# Patient Record
Sex: Female | Born: 1980 | Race: White | Hispanic: No | Smoking: Never smoker
Health system: Southern US, Community
[De-identification: ages and names within clinical notes are randomized; demographics above are authoritative.]

## PROBLEM LIST (undated history)

## (undated) DIAGNOSIS — N10 Acute tubulo-interstitial nephritis: Secondary | ICD-10-CM

## (undated) DIAGNOSIS — R7303 Prediabetes: Secondary | ICD-10-CM

## (undated) DIAGNOSIS — K219 Gastro-esophageal reflux disease without esophagitis: Secondary | ICD-10-CM

## (undated) DIAGNOSIS — G43909 Migraine, unspecified, not intractable, without status migrainosus: Secondary | ICD-10-CM

## (undated) DIAGNOSIS — K5900 Constipation, unspecified: Secondary | ICD-10-CM

## (undated) DIAGNOSIS — T8859XA Other complications of anesthesia, initial encounter: Secondary | ICD-10-CM

## (undated) DIAGNOSIS — T4145XA Adverse effect of unspecified anesthetic, initial encounter: Secondary | ICD-10-CM

## (undated) DIAGNOSIS — G8929 Other chronic pain: Secondary | ICD-10-CM

## (undated) DIAGNOSIS — M549 Dorsalgia, unspecified: Secondary | ICD-10-CM

## (undated) DIAGNOSIS — Z87442 Personal history of urinary calculi: Secondary | ICD-10-CM

## (undated) DIAGNOSIS — F419 Anxiety disorder, unspecified: Secondary | ICD-10-CM

## (undated) HISTORY — PX: LITHOTRIPSY: SUR834

---

## 2005-01-24 HISTORY — PX: TUBAL LIGATION: SHX77

## 2011-01-25 HISTORY — PX: REDUCTION MAMMAPLASTY: SUR839

## 2011-05-25 HISTORY — PX: ABDOMINAL HYSTERECTOMY: SHX81

## 2012-07-07 ENCOUNTER — Encounter (HOSPITAL_COMMUNITY): Payer: Self-pay | Admitting: *Deleted

## 2012-07-07 DIAGNOSIS — R51 Headache: Secondary | ICD-10-CM | POA: Insufficient documentation

## 2012-07-07 DIAGNOSIS — R110 Nausea: Secondary | ICD-10-CM | POA: Insufficient documentation

## 2012-07-07 DIAGNOSIS — M542 Cervicalgia: Secondary | ICD-10-CM | POA: Insufficient documentation

## 2012-07-07 MED ORDER — ONDANSETRON HCL 4 MG PO TABS: 8.0000 mg | ORAL_TABLET | Freq: Once | ORAL | Status: DC

## 2012-07-07 MED ORDER — ONDANSETRON 4 MG PO TBDP
ORAL_TABLET | ORAL | Status: AC
  Administered 2012-07-07: 8 mg
  Filled 2012-07-07: qty 2

## 2012-07-07 NOTE — ED Notes (Signed)
Real bad migraine.[place] Pages oxycodone 10 mg ~ 1700 and x 20 mg prior to coming to ED.   No relief.  Frontal h/a and neck pain. Nauseated.

## 2012-07-08 ENCOUNTER — Emergency Department (HOSPITAL_COMMUNITY)
Admission: EM | Admit: 2012-07-08 | Discharge: 2012-07-08 | Discharge status: Against Medical Advice | Payer: BC Managed Care – PPO | Attending: Emergency Medicine | Admitting: Emergency Medicine

## 2012-07-08 HISTORY — DX: Migraine, unspecified, not intractable, without status migrainosus: G43.909

## 2012-07-08 NOTE — ED Notes (Signed)
Pt states that she has been having headaches today.  She did have some nausea, but took some phenergan she was prescribe to relieve.  Pt states that she only needs a shot to help with the pain.

## 2012-11-09 ENCOUNTER — Emergency Department (HOSPITAL_COMMUNITY)
Admission: EM | Admit: 2012-11-09 | Discharge: 2012-11-09 | Disposition: A | Discharge status: Home / Self Care | Payer: BC Managed Care – PPO | Attending: Emergency Medicine | Admitting: Emergency Medicine

## 2012-11-09 ENCOUNTER — Encounter (HOSPITAL_COMMUNITY): Payer: Self-pay | Admitting: Emergency Medicine

## 2012-11-09 ENCOUNTER — Other Ambulatory Visit: Payer: Self-pay

## 2012-11-09 ENCOUNTER — Other Ambulatory Visit: Payer: Self-pay | Admitting: Urology

## 2012-11-09 DIAGNOSIS — R0789 Other chest pain: Secondary | ICD-10-CM | POA: Insufficient documentation

## 2012-11-09 DIAGNOSIS — R1032 Left lower quadrant pain: Secondary | ICD-10-CM | POA: Insufficient documentation

## 2012-11-09 DIAGNOSIS — Z90710 Acquired absence of both cervix and uterus: Secondary | ICD-10-CM | POA: Insufficient documentation

## 2012-11-09 DIAGNOSIS — G43909 Migraine, unspecified, not intractable, without status migrainosus: Secondary | ICD-10-CM | POA: Insufficient documentation

## 2012-11-09 LAB — CBC WITH DIFFERENTIAL/PLATELET
Eosinophils Absolute: 0.1 10*3/uL (ref 0.0–0.7)
Hemoglobin: 11.5 g/dL — ABNORMAL LOW (ref 12.0–15.0)
Lymphocytes Relative: 24 % (ref 12–46)
Lymphs Abs: 2.3 10*3/uL (ref 0.7–4.0)
Monocytes Relative: 6 % (ref 3–12)
Neutro Abs: 6.8 10*3/uL (ref 1.7–7.7)
Neutrophils Relative %: 70 % (ref 43–77)
Platelets: 182 10*3/uL (ref 150–400)
RBC: 4.67 MIL/uL (ref 3.87–5.11)
WBC: 9.8 10*3/uL (ref 4.0–10.5)

## 2012-11-09 LAB — COMPREHENSIVE METABOLIC PANEL
AST: 17 U/L (ref 0–37)
Albumin: 3.8 g/dL (ref 3.5–5.2)
Chloride: 102 mEq/L (ref 96–112)
Creatinine, Ser: 0.75 mg/dL (ref 0.50–1.10)
Total Bilirubin: 0.5 mg/dL (ref 0.3–1.2)
Total Protein: 7.6 g/dL (ref 6.0–8.3)

## 2012-11-09 LAB — LIPASE, BLOOD: Lipase: 48 U/L (ref 11–59)

## 2012-11-09 LAB — URINALYSIS, ROUTINE W REFLEX MICROSCOPIC
Glucose, UA: NEGATIVE mg/dL
Ketones, ur: NEGATIVE mg/dL
Leukocytes, UA: NEGATIVE
Nitrite: NEGATIVE
Specific Gravity, Urine: 1.032 — ABNORMAL HIGH (ref 1.005–1.030)
pH: 5.5 (ref 5.0–8.0)

## 2012-11-09 LAB — CG4 I-STAT (LACTIC ACID): Lactic Acid, Venous: 0.45 mmol/L — ABNORMAL LOW (ref 0.5–2.2)

## 2012-11-09 MED ORDER — ONDANSETRON HCL 4 MG/2ML IJ SOLN: 4.0000 mg | Freq: Once | INTRAMUSCULAR | Status: DC

## 2012-11-09 MED ORDER — METOCLOPRAMIDE HCL 5 MG/ML IJ SOLN
10.0000 mg | Freq: Once | INTRAMUSCULAR | Status: AC
  Administered 2012-11-09: 10 mg via INTRAVENOUS
  Filled 2012-11-09: qty 2

## 2012-11-09 MED ORDER — POTASSIUM CHLORIDE CRYS ER 20 MEQ PO TBCR
20.0000 meq | EXTENDED_RELEASE_TABLET | Freq: Once | ORAL | Status: AC
  Administered 2012-11-09: 20 meq via ORAL
  Filled 2012-11-09: qty 1

## 2012-11-09 MED ORDER — HYDROCODONE-ACETAMINOPHEN 5-325 MG PO TABS: 2.0000 | ORAL_TABLET | ORAL | Status: DC | PRN

## 2012-11-09 MED ORDER — SODIUM CHLORIDE 0.9 % IV BOLUS (SEPSIS)
1000.0000 mL | Freq: Once | INTRAVENOUS | Status: AC
  Administered 2012-11-09: 1000 mL via INTRAVENOUS

## 2012-11-09 MED ORDER — HYDROMORPHONE HCL PF 1 MG/ML IJ SOLN
1.0000 mg | Freq: Once | INTRAMUSCULAR | Status: AC
  Administered 2012-11-09: 1 mg via INTRAVENOUS
  Filled 2012-11-09: qty 1

## 2012-11-09 MED ORDER — HYDROMORPHONE HCL PF 1 MG/ML IJ SOLN
0.5000 mg | Freq: Once | INTRAMUSCULAR | Status: AC
  Administered 2012-11-09: 0.5 mg via INTRAVENOUS
  Filled 2012-11-09: qty 1

## 2012-11-09 NOTE — ED Notes (Signed)
Pt tolerating water.  

## 2012-11-09 NOTE — ED Notes (Signed)
Patient unable to urinate at this time. Patient asked about blood work and pain medicine. Explained to patient that the nurse is going to start an IV. I told patient that I would let the nurse know about the pain meds.

## 2012-11-09 NOTE — ED Notes (Addendum)
Pt complains of abd pain, that has been going on for several months, worse since 0300 today. Pt has been to PCP [facility] a week ago and has been diagnosed with "gallbladder and pancreas" problem. Pt was to follow up with specialist, but pain is too bad. Pt also having surgery on Monday for kidney stone removal. Pt states she has also been having chest tightness since this am. Denies sob or dizziness.

## 2012-11-09 NOTE — ED Provider Notes (Signed)
CSN: [id number]     Arrival date & time 11/09/12  1638 History   First MD Initiated Contact with Patient 11/09/12 1651     Chief Complaint  Patient presents with  . Abdominal Pain   (Consider location/radiation/quality/duration/timing/severity/associated sxs/prior Treatment) HPI 32 year old female presents with multiple complaints. Patient states for the past month she has had recurrent abdominal pain. Pain is primarily to her low abdomen, described as a stabbing sensation, and for the past week it has been radiates to her upper abdomen into her back. Pain is waxing waning, nothing to make it better or worse. Today the pain is so bad that she experiencing chest tightness. Endorse nausea without vomiting or diarrhea. Last bowel movement was today. Does not think food affect the symptom. Patient states she was seen by her primary care Dr. for this complaint about a week ago. At that time she was diagnosed with having gallbladder and pancreas problem and was recommended to followup with a GI specialist. She was also found to have a large kidney stone that may require lithotripsy to remove. She was seen by urologist yesterday and is scheduled for procedure next week. Otherwise patient denies any significant fever, chills, headache, chest pain, shortness of breath, productive cough, hemoptysis, lightheadedness, dizziness, dysuria, vaginal bleeding, vaginal discharge, or rash. She reports that she has a total hysterectomy. She also mentioned that she has a urine checked yesterday with no signs of infection. She also report having a pelvic exam done last week had a PCP with no acute finding. She is sexually active with one partner only. She denies history of diabetes or history of alcohol abuse.   Past Medical History  Diagnosis Date  . Migraine    Past Surgical History  Procedure Laterality Date  . Abdominal hysterectomy    . Breast reduction surgery     No family history on file. History   Substance Use Topics  . Smoking status: Never Smoker   . Smokeless tobacco: Not on file  . Alcohol Use: No   OB History   Grav Para Term Preterm Abortions TAB SAB Ect Mult Living                 Review of Systems  All other systems reviewed and are negative.    Allergies  Review of patient's allergies indicates no known allergies.  Home Medications   Current Outpatient Rx  Name  Route  Sig  Dispense  Refill  . Oxycodone HCl 10 MG TABS   Oral   Take 10-20 mg by mouth as needed (for migraine headache).          . promethazine (PHENERGAN) 25 MG tablet   Oral   Take 25 mg by mouth as needed for nausea (associated with migraines).           BP 110/77  Pulse 89  Temp(Src) 98.5 F (36.9 C) (Oral)  Resp 20  Wt 179 lb (81.194 kg)  SpO2 99% Physical Exam  Nursing note and vitals reviewed. Constitutional: She is oriented to person, place, and time. She appears well-developed and well-nourished. No distress.  Awake, alert, nontoxic appearance  HENT:  Head: Atraumatic.  Eyes: Conjunctivae are normal. Right eye exhibits no discharge. Left eye exhibits no discharge.  Neck: Neck supple.  Cardiovascular: Normal rate and regular rhythm.   Pulmonary/Chest: Effort normal. No respiratory distress. She exhibits no tenderness.  Abdominal: Soft. There is tenderness (Mild suprapubic and left lower quadrant tenderness on palpation without guarding or rebound  tenderness. No overlying skin changes. No rash or hernia noted.). There is no rebound.  No Murphy sign, no McBurney's point.  Genitourinary:  Deferred, as patient report having pelvic exam last week.  No CVA tenderness   Musculoskeletal: She exhibits no tenderness.  ROM appears intact, no obvious focal weakness  Neurological: She is alert and oriented to person, place, and time.  Mental status and motor strength appears intact  Skin: No rash noted.  Psychiatric: She has a normal mood and affect.    ED Course  Procedures  (including critical care time)  5:28 PM Patient here with multiple complaints including chest tightness, abdominal pain, and reports of having gallbladder and pancreas problem. States she has an abdominal ultrasound recently and is currently managed by a GI specialist.  Pt also report she has pelvic exam done last week and does not want to have it repeat today.  Sts everything was normal.  Will defer testing per pt's request. She is well appearing, afebrile with stable normal vital signs. She has a nonsurgical abdomen. Workup initiated, pain medication and antinausea medication given.  Care discussed with attending.  7:54 PM Pt sts she feels much better and request to be discharge.  She able to tolerates PO.  Her labs are reassuring.  She has close follow up both with GI specialist and with Urologist. Her urine and CBC has not resulted yet.  Pt aware but sts she has it checked recently and it was normal and she feels safe to go home.  I recommend strict return precaution if sxs worsen.  Pt voice understanding and agrees with plan.     Date: 11/09/2012  Rate: 84  Rhythm: normal sinus rhythm  QRS Axis: normal  Intervals: normal  ST/T Wave abnormalities: normal  Conduction Disutrbances: none  Narrative Interpretation:   Old EKG Reviewed: no prior for comparison    Labs Review Labs Reviewed  COMPREHENSIVE METABOLIC PANEL - Abnormal; Notable for the following:    Potassium 3.3 (*)    All other components within normal limits  CG4 I-STAT (LACTIC ACID) - Abnormal; Notable for the following:    Lactic Acid, Venous 0.45 (*)    All other components within normal limits  LIPASE, BLOOD  URINALYSIS, ROUTINE W REFLEX MICROSCOPIC  CBC WITH DIFFERENTIAL   Imaging Review No results found.  EKG Interpretation   None       MDM   1. Abdominal pain, lower, unspecified laterality    BP 110/77  Pulse 89  Temp(Src) 98.5 F (36.9 C) (Oral)  Resp 20  Wt 179 lb (81.194 kg)  SpO2  99%      Fayrene Helper, PA-C 11/09/12 1956

## 2012-11-10 NOTE — ED Provider Notes (Signed)
Medical screening examination/treatment/procedure(s) were performed by non-physician practitioner and as supervising physician I was immediately available for consultation/collaboration.   Candyce Churn, MD 11/10/12 0000

## 2012-11-11 DIAGNOSIS — N200 Calculus of kidney: Secondary | ICD-10-CM | POA: Diagnosis present

## 2012-11-11 NOTE — H&P (Signed)
istory of Present Illness     This is a 32 year old female with a nonobstructing 8 mm right renal calculus noted on ultrasound as part of a workup for abdominal pain and elevated pancreatic enzymes.  Patient's symptoms include pain radiating down her legs bilaterally worse when she walks.  She also is complaining of pain radiating around from her back bilaterally.  She denies any flank pain specifically and is not complaining of any dysuria, hematuria, or suprapubic pain.  The pain seems to be constant in her legs and intermittent in her abdomen associated with eating.  Also noted on the abdominal ultrasound was a thickened gallbladder wall.  The patient has had lithotripsy on her right side once before - she relates that she had 2 kidney stones on the right side at that time.  She had no issues with this.  He is interested in taking care of her stone before it causes her any further problems.   Past Medical History Problems  1. History of  Esophageal Reflux 530.81  Surgical History Problems  1. History of  Breast Surgery Reduction Procedure 2. History of  Hysterectomy V45.77 3. History of  Tubal Ligation V25.2  Current Meds 1. Percocet 10-325 MG Oral Tablet; Therapy: (Recorded:16Oct2014) to 2. Phenergan 25 MG TABS; Therapy: (Recorded:16Oct2014) to 3. Phentermine HCl 37.5 MG Oral Capsule; Therapy: (Recorded:16Oct2014) to  Allergies Medication  1. No Known Drug Allergies  Family History Problems  1. Paternal history of  Family Health Status Number Of Children 1 son 2 daughters 2. Paternal history of  Hematuria 3. Paternal history of  Prostate Cancer V16.42  Social History Problems    Caffeine Use   Marital History - Single   Never A Smoker Denied    History of  Alcohol Use  Review of Systems  Genitourinary: nocturia, hematuria and cloudy urine.  Gastrointestinal: nausea, vomiting, heartburn, diarrhea and constipation.  Constitutional: feeling tired (fatigue).   Integumentary: pruritus.  Eyes: blurred vision.  ENT: sore throat and sinus problems.  Hematologic/Lymphatic: a tendency to easily bruise.  Cardiovascular: chest pain.  Neurological: dizziness and headache.  Psychiatric: depression and anxiety.    Vitals Vital Signs  Blood Pressure: 99 / 62 Temperature: 97.6 F Heart Rate: 76  BMI Calculated: 33.79 BSA Calculated: 1.8 Height: 5 ft 1 in Weight: 179 lb   Physical Exam Constitutional: Well nourished and well developed . No acute distress. Obese.  Pulmonary: No respiratory distress and normal respiratory rhythm and effort.  Skin: Normal skin turgor, no visible rash and no visible skin lesions.  Neuro/Psych:. Mood and affect are appropriate.    Results/Data Urine COLOR YELLOW   APPEARANCE CLEAR   SPECIFIC GRAVITY 1.025   pH 6.5   GLUCOSE NEG mg/dL  BILIRUBIN NEG   KETONE NEG mg/dL  BLOOD NEG   PROTEIN NEG mg/dL  UROBILINOGEN 0.2 mg/dL  NITRITE NEG   LEUKOCYTE ESTERASE NEG     Abdominal ultrasound report was reviewed, there is note of an 8 mm hypoechoic area in the right kidney with posterior shadowing.  There is also mention of a right-sided extrarenal pelvis.    KUB: Image was obtained in our office today to help identify her stone.  Both kidney shadows are visible and  within the right kidney shadow there is a 6 x 6 cm stone located in the lower pole.  There are no additional stones within either kidney shadow or the trajectory of the ureter.  There are scattered calculi within the pelvis.  Assessment  Nonobstructing right renal calculi   Plan   I discussed the options for managing  nonobstructing kidney stones.  He understands that his stone is likely to increase in size and that over time this may cause obstructing to part of his kidney resulting in decreased renal function.I went over conservative management consisting of surviellence with serial imaging with KUB and renal ultrasound to assess for interval growth.   We also discussed more aggressive interventions including Shockwave lithotripsy and ureteroscopy.  I briefly explained to him what the different treatment modalities consisted of, and what he might expect with each treatment.    I have recommended shockwave lithotripsy.

## 2012-11-12 ENCOUNTER — Encounter (HOSPITAL_COMMUNITY): Payer: Self-pay | Admitting: *Deleted

## 2012-11-12 ENCOUNTER — Encounter (HOSPITAL_COMMUNITY)
Admission: RE | Disposition: A | Discharge status: Home / Self Care | Payer: Self-pay | Source: Ambulatory Visit | Attending: Urology

## 2012-11-12 ENCOUNTER — Ambulatory Visit (HOSPITAL_COMMUNITY): Payer: BC Managed Care – PPO

## 2012-11-12 ENCOUNTER — Ambulatory Visit (HOSPITAL_COMMUNITY)
Admission: RE | Admit: 2012-11-12 | Discharge: 2012-11-12 | Disposition: A | Discharge status: Home / Self Care | Payer: BC Managed Care – PPO | Source: Ambulatory Visit | Attending: Urology | Admitting: Urology

## 2012-11-12 DIAGNOSIS — N200 Calculus of kidney: Secondary | ICD-10-CM | POA: Diagnosis present

## 2012-11-12 DIAGNOSIS — K219 Gastro-esophageal reflux disease without esophagitis: Secondary | ICD-10-CM | POA: Insufficient documentation

## 2012-11-12 SURGERY — LITHOTRIPSY, ESWL
Anesthesia: LOCAL | Laterality: Right

## 2012-11-12 MED ORDER — DIAZEPAM 5 MG PO TABS
10.0000 mg | ORAL_TABLET | ORAL | Status: AC
  Administered 2012-11-12: 10 mg via ORAL
  Filled 2012-11-12: qty 2

## 2012-11-12 MED ORDER — TAMSULOSIN HCL 0.4 MG PO CAPS: 0.4000 mg | ORAL_CAPSULE | ORAL | Status: DC

## 2012-11-12 MED ORDER — SODIUM CHLORIDE 0.9 % IV SOLN
INTRAVENOUS | Status: DC
  Administered 2012-11-12: 08:00:00 via INTRAVENOUS

## 2012-11-12 MED ORDER — CIPROFLOXACIN HCL 500 MG PO TABS
500.0000 mg | ORAL_TABLET | ORAL | Status: DC
  Filled 2012-11-12: qty 1

## 2012-11-12 MED ORDER — DIPHENHYDRAMINE HCL 25 MG PO CAPS
25.0000 mg | ORAL_CAPSULE | ORAL | Status: AC
  Administered 2012-11-12: 25 mg via ORAL
  Filled 2012-11-12: qty 1

## 2012-11-12 MED ORDER — CIPROFLOXACIN IN D5W 400 MG/200ML IV SOLN
400.0000 mg | Freq: Two times a day (BID) | INTRAVENOUS | Status: DC
  Administered 2012-11-12: 400 mg via INTRAVENOUS
  Filled 2012-11-12: qty 200

## 2012-11-12 MED ORDER — OXYCODONE-ACETAMINOPHEN 10-325 MG PO TABS: 1.0000 | ORAL_TABLET | ORAL | Status: DC | PRN

## 2012-11-12 MED ORDER — OXYCODONE-ACETAMINOPHEN 5-325 MG PO TABS
1.0000 | ORAL_TABLET | Freq: Once | ORAL | Status: AC
  Administered 2012-11-12: 1 via ORAL
  Filled 2012-11-12: qty 1

## 2012-11-12 NOTE — Op Note (Signed)
See [facility] OP note scanned into chart. 

## 2012-11-12 NOTE — Interval H&P Note (Signed)
History and Physical Interval Note:  11/12/2012 9:43 AM  Mary Morse  has presented today for surgery, with the diagnosis of Right Renal Calculus  The various methods of treatment have been discussed with the patient and family. After consideration of risks, benefits and other options for treatment, the patient has consented to  Procedure(s): RIGHT EXTRACORPOREAL SHOCK WAVE LITHOTRIPSY (ESWL) (Right) as a surgical intervention .  The patient's history has been reviewed, patient examined, no change in status, stable for surgery.  I have reviewed the patient's chart and labs.  Questions were answered to the patient's satisfaction.     G[place]ett Farm

## 2012-11-13 ENCOUNTER — Ambulatory Visit (INDEPENDENT_AMBULATORY_CARE_PROVIDER_SITE_OTHER): Payer: BC Managed Care – PPO | Admitting: General Surgery

## 2012-11-13 ENCOUNTER — Encounter (INDEPENDENT_AMBULATORY_CARE_PROVIDER_SITE_OTHER): Payer: Self-pay | Admitting: General Surgery

## 2012-11-13 ENCOUNTER — Other Ambulatory Visit (INDEPENDENT_AMBULATORY_CARE_PROVIDER_SITE_OTHER): Payer: Self-pay | Admitting: General Surgery

## 2012-11-13 ENCOUNTER — Telehealth (INDEPENDENT_AMBULATORY_CARE_PROVIDER_SITE_OTHER): Payer: Self-pay | Admitting: *Deleted

## 2012-11-13 VITALS — BP 118/70 | HR 64 | Temp 98.4°F | Resp 14 | Ht 61.0 in | Wt 182.8 lb

## 2012-11-13 DIAGNOSIS — R1031 Right lower quadrant pain: Secondary | ICD-10-CM

## 2012-11-13 DIAGNOSIS — K8020 Calculus of gallbladder without cholecystitis without obstruction: Secondary | ICD-10-CM | POA: Insufficient documentation

## 2012-11-13 NOTE — Telephone Encounter (Signed)
I spoke with pt to inform her of appt for [place] at [facility] on 11/14/12 with an arrival time of 10:30am.  Instructed pt to be NPO after midnight tonight.  Pt requested phone number so I provided this to pt.

## 2012-11-13 NOTE — Patient Instructions (Signed)
Will obtain records from medical doctor If there are gallstones then plan for lap chole with ioc

## 2012-11-14 ENCOUNTER — Ambulatory Visit
Admission: RE | Admit: 2012-11-14 | Discharge: 2012-11-14 | Disposition: A | Discharge status: Home / Self Care | Payer: BC Managed Care – PPO | Source: Ambulatory Visit | Attending: General Surgery | Admitting: General Surgery

## 2012-11-14 ENCOUNTER — Other Ambulatory Visit (INDEPENDENT_AMBULATORY_CARE_PROVIDER_SITE_OTHER): Payer: Self-pay | Admitting: General Surgery

## 2012-11-14 DIAGNOSIS — K8020 Calculus of gallbladder without cholecystitis without obstruction: Secondary | ICD-10-CM

## 2012-11-14 NOTE — Progress Notes (Signed)
Patient ID: Mary Morse, female   DOB: Feb 05, 1980, 32 y.o.   MRN: [id number]  Chief Complaint  Patient presents with  . New Evaluation    eval GB/ pancreas    HPI Mary Morse is a 32 y.o. female.  We're asked to see the patient in consultation by Dr. Laural Benes at [facility] in [place] to evaluate her for gallstones. The patient is a 32 year old white female who has been complaining of lower abnormal pain on both sides for the last month. She has had nausea and several episodes of vomiting. As part of her workup she underwent an ultrasound in the office at her medical doctors practice that apparently showed some inflammatory change of her gallbladder wall. It did not identify any gallstones. At that time she also had some slight elevation of her lipase. Her most recent lab work several days ago showed normal liver functions and a normal lipase. She continues to have constant lower abdominal pain. HPI  Past Medical History  Diagnosis Date  . Migraine   . Kidney stone     Past Surgical History  Procedure Laterality Date  . Abdominal hysterectomy    . Breast reduction surgery    . Tubal ligation      Family History  Problem Relation Age of Onset  . Cancer Father     prostate  . Cancer Paternal Grandmother     breast    Social History History  Substance Use Topics  . Smoking status: Never Smoker   . Smokeless tobacco: Never Used  . Alcohol Use: No    No Known Allergies  Current Outpatient Prescriptions  Medication Sig Dispense Refill  . ALPRAZolam (XANAX) 1 MG tablet Take 1 mg by mouth at bedtime as needed for anxiety.      . Linaclotide (LINZESS) 290 MCG CAPS capsule Take 290 mcg by mouth daily.      Marland Kitchen oxyCODONE-acetaminophen (PERCOCET) 10-325 MG per tablet Take 1-2 tablets by mouth every 4 (four) hours as needed for pain.  30 tablet  0  . promethazine (PHENERGAN) 25 MG tablet Take 25 mg by mouth as needed for nausea.       . tamsulosin (FLOMAX) 0.4 MG  CAPS capsule Take 1 capsule (0.4 mg total) by mouth daily after supper.  30 capsule  11  . phentermine 37.5 MG capsule Take 37.5 mg by mouth every morning.       No current facility-administered medications for this visit.    Review of Systems Review of Systems  Constitutional: Negative.   HENT: Negative.   Eyes: Negative.   Respiratory: Negative.   Cardiovascular: Negative.   Gastrointestinal: Positive for nausea and abdominal pain.  Endocrine: Negative.   Genitourinary: Negative.   Musculoskeletal: Negative.   Skin: Negative.   Allergic/Immunologic: Negative.   Neurological: Negative.   Hematological: Negative.   Psychiatric/Behavioral: Negative.     Blood pressure 118/70, pulse 64, temperature 98.4 F (36.9 C), temperature source Temporal, resp. rate 14, height 5\' 1"  (1.549 m), weight 182 lb 12.8 oz (82.918 kg).  Physical Exam Physical Exam  Constitutional: She is oriented to person, place, and time. She appears well-developed and well-nourished.  HENT:  Head: Normocephalic and atraumatic.  Eyes: Conjunctivae and EOM are normal. Pupils are equal, round, and reactive to light.  Neck: Normal range of motion. Neck supple.  Cardiovascular: Normal rate, regular rhythm and normal heart sounds.   Pulmonary/Chest: Effort normal and breath sounds normal.  Abdominal: Soft. Bowel sounds are normal.  There is mild to moderate lower abdominal pain with no guarding or peritonitis. There is no palpable mass. There is also some mild right upper quadrant tenderness.  Musculoskeletal: Normal range of motion.  Neurological: She is alert and oriented to person, place, and time.  Skin: Skin is warm and dry.  Psychiatric: She has a normal mood and affect. Her behavior is normal.    Data Reviewed As above  Assessment    It sounds as though the patient may have had an episode of gallstone pancreatitis although her previous ultrasound did not show any gallstones. Although her lab work is  all normal if she continues to have lower abdominal pain and some mild right upper quadrant pain. Since her finding seemed to be discordant with her exam I think it would be reasonable to repeat her abdominal ultrasound locally with our radiologist.     Plan    If she does have evidence of biliary disease and I think she would probably benefit from having her gallbladder removed. I think she would be a good candidate for laparoscopic cholecystectomy. I have discussed with her in detail the risks and benefits of the operation to remove the gallbladder as well as some of the technical aspects and she understands and wishes to proceed. She also understands that I cannot guarantee that removing her gallbladder will take all of her pains away since her pains are atypical.        TOTH III,Nolene Rocks S 11/14/2012, 1:20 PM

## 2012-11-15 ENCOUNTER — Other Ambulatory Visit (INDEPENDENT_AMBULATORY_CARE_PROVIDER_SITE_OTHER): Payer: Self-pay | Admitting: General Surgery

## 2012-11-15 ENCOUNTER — Telehealth (INDEPENDENT_AMBULATORY_CARE_PROVIDER_SITE_OTHER): Payer: Self-pay

## 2012-11-15 NOTE — Telephone Encounter (Signed)
The pt called back.  She wants to know what Dr Carolynne Edouard recommends she do.  She asked if the polyps can just be removed or can you only remove the whole gallbladder?  If not, she would just like surgery.  She is worried of the chance of cancer.  Please call her back

## 2012-11-15 NOTE — Telephone Encounter (Signed)
orers given to stephanie in surgery scheduling.

## 2012-11-15 NOTE — Telephone Encounter (Signed)
Tried calling pt back. No answer. Will have to take entire GB out. Cant just take out polyps. As for her being scared of cancer, looks benign but can never say 100% without getting it out and sending it to pathology. Orders are ready if she decides to schedule. Just need to know. If she has any more questions when she calls back I will be glad to talk with her.

## 2012-11-15 NOTE — Telephone Encounter (Signed)
LMOM> Please give [place] results below. If she would like GB out please let us know and he can do orders. Advise this may not fix all her pain though.

## 2012-11-15 NOTE — Telephone Encounter (Signed)
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 This can sometimes cause pain. i am willing to take it out but i don't think this is going to fix all her pains ------

## 2012-11-15 NOTE — Telephone Encounter (Signed)
I informed the pt of the results and Dr Billey Chang message.  She wants to talk to her Fiance and will let us know if she wants to schedule.

## 2012-11-15 NOTE — Telephone Encounter (Signed)
Pt returned call. I gave her the message from [place] about needing the whole gallbladder removed to get the polyps out in order to send the specimen to pathology to know 100% what was going on with the polyps along with the gallbladder. The pt wants to proceed with scheduling surgery. I notified Marcelino Duster who will turn the pt's surgical orders into scheduling today.

## 2012-11-16 ENCOUNTER — Ambulatory Visit (INDEPENDENT_AMBULATORY_CARE_PROVIDER_SITE_OTHER): Payer: BC Managed Care – PPO | Admitting: General Surgery

## 2012-11-19 ENCOUNTER — Telehealth (INDEPENDENT_AMBULATORY_CARE_PROVIDER_SITE_OTHER): Payer: Self-pay | Admitting: General Surgery

## 2012-11-19 NOTE — Telephone Encounter (Signed)
I spoke with pt on 10/23 and went over benefits and financial responsibility, she will call me back when she is ready to schedule. skm

## 2012-11-21 ENCOUNTER — Encounter (INDEPENDENT_AMBULATORY_CARE_PROVIDER_SITE_OTHER): Payer: Self-pay

## 2012-11-26 ENCOUNTER — Encounter (INDEPENDENT_AMBULATORY_CARE_PROVIDER_SITE_OTHER): Payer: Self-pay

## 2012-11-28 ENCOUNTER — Encounter (HOSPITAL_COMMUNITY): Payer: Self-pay | Admitting: Pharmacy Technician

## 2012-11-29 ENCOUNTER — Encounter (INDEPENDENT_AMBULATORY_CARE_PROVIDER_SITE_OTHER): Payer: Self-pay

## 2012-11-30 NOTE — Progress Notes (Signed)
Surgery day has been moved from Monday to Thursday, November 13th.  I was unable to reach patient by phone,  I left a voice massage that we will call her next week and that she needs to  Have been off Phentermine 2 weeks.

## 2012-12-04 ENCOUNTER — Encounter (HOSPITAL_COMMUNITY): Payer: Self-pay | Admitting: *Deleted

## 2012-12-04 NOTE — Progress Notes (Signed)
Patient reported that she had an ECHO prior to starting Phentermine at [facility] in [place], [place].  I faxed a request for this report and last office note.Patient stated that she stopped taking Phentermine around the first of October, 2014.

## 2012-12-05 MED ORDER — CEFAZOLIN SODIUM-DEXTROSE 2-3 GM-% IV SOLR
2.0000 g | INTRAVENOUS | Status: AC
  Administered 2012-12-06: 2 g via INTRAVENOUS

## 2012-12-06 ENCOUNTER — Encounter (HOSPITAL_COMMUNITY)
Admission: RE | Disposition: A | Discharge status: Home / Self Care | Payer: Self-pay | Source: Ambulatory Visit | Attending: General Surgery

## 2012-12-06 ENCOUNTER — Observation Stay (HOSPITAL_COMMUNITY)
Admission: RE | Admit: 2012-12-06 | Discharge: 2012-12-07 | Disposition: A | Discharge status: Home / Self Care | Payer: BC Managed Care – PPO | Source: Ambulatory Visit | Attending: General Surgery | Admitting: General Surgery

## 2012-12-06 ENCOUNTER — Encounter (HOSPITAL_COMMUNITY): Payer: BC Managed Care – PPO | Admitting: Anesthesiology

## 2012-12-06 ENCOUNTER — Encounter (HOSPITAL_COMMUNITY): Payer: Self-pay | Admitting: General Practice

## 2012-12-06 ENCOUNTER — Ambulatory Visit (HOSPITAL_COMMUNITY): Payer: BC Managed Care – PPO

## 2012-12-06 ENCOUNTER — Ambulatory Visit (HOSPITAL_COMMUNITY): Payer: BC Managed Care – PPO | Admitting: Anesthesiology

## 2012-12-06 DIAGNOSIS — E119 Type 2 diabetes mellitus without complications: Secondary | ICD-10-CM | POA: Insufficient documentation

## 2012-12-06 DIAGNOSIS — K811 Chronic cholecystitis: Principal | ICD-10-CM | POA: Insufficient documentation

## 2012-12-06 DIAGNOSIS — R1011 Right upper quadrant pain: Secondary | ICD-10-CM | POA: Insufficient documentation

## 2012-12-06 DIAGNOSIS — K8020 Calculus of gallbladder without cholecystitis without obstruction: Secondary | ICD-10-CM

## 2012-12-06 HISTORY — DX: Constipation, unspecified: K59.00

## 2012-12-06 HISTORY — DX: Other complications of anesthesia, initial encounter: T88.59XA

## 2012-12-06 HISTORY — DX: Adverse effect of unspecified anesthetic, initial encounter: T41.45XA

## 2012-12-06 HISTORY — DX: Prediabetes: R73.03

## 2012-12-06 HISTORY — DX: Anxiety disorder, unspecified: F41.9

## 2012-12-06 HISTORY — PX: CHOLECYSTECTOMY: SHX55

## 2012-12-06 HISTORY — DX: Other chronic pain: G89.29

## 2012-12-06 HISTORY — DX: Dorsalgia, unspecified: M54.9

## 2012-12-06 LAB — CREATININE, SERUM
GFR calc Af Amer: >90 mL/min (ref >90)
GFR calc non Af Amer: >90 mL/min (ref >90)

## 2012-12-06 LAB — CBC
HCT: 36.6 % (ref 36.0–46.0)
Hemoglobin: 12.2 g/dL (ref 12.0–15.0)
MCH: 24.8 pg — ABNORMAL LOW (ref 26.0–34.0)
MCH: 25.4 pg — ABNORMAL LOW (ref 26.0–34.0)
MCV: 76.3 fL — ABNORMAL LOW (ref 78.0–100.0)
Platelets: 174 10*3/uL (ref 150–400)
Platelets: 154 10*3/uL (ref 150–400)
RBC: 4.8 MIL/uL (ref 3.87–5.11)
RDW: 15.7 % — ABNORMAL HIGH (ref 11.5–15.5)
RDW: 15.8 % — ABNORMAL HIGH (ref 11.5–15.5)
WBC: 9.7 10*3/uL (ref 4.0–10.5)
WBC: 11.2 10*3/uL — ABNORMAL HIGH (ref 4.0–10.5)

## 2012-12-06 LAB — GLUCOSE, CAPILLARY: Glucose-Capillary: 90 mg/dL (ref 70–99)

## 2012-12-06 SURGERY — LAPAROSCOPIC CHOLECYSTECTOMY WITH INTRAOPERATIVE CHOLANGIOGRAM
Anesthesia: General | Site: Abdomen | Wound class: Contaminated

## 2012-12-06 MED ORDER — MIDAZOLAM HCL 2 MG/2ML IJ SOLN: 0.5000 mg | Freq: Once | INTRAMUSCULAR | Status: DC | PRN

## 2012-12-06 MED ORDER — OXYCODONE HCL 5 MG PO TABS
5.0000 mg | ORAL_TABLET | ORAL | Status: DC | PRN
  Administered 2012-12-07 (×2): 10 mg via ORAL
  Filled 2012-12-06 (×2): qty 2

## 2012-12-06 MED ORDER — OXYCODONE HCL 5 MG PO TABS: 5.0000 mg | ORAL_TABLET | Freq: Once | ORAL | Status: DC | PRN

## 2012-12-06 MED ORDER — GLYCOPYRROLATE 0.2 MG/ML IJ SOLN
INTRAMUSCULAR | Status: DC | PRN
  Administered 2012-12-06: 0.6 mg via INTRAVENOUS
  Administered 2012-12-06: 0.2 mg via INTRAVENOUS

## 2012-12-06 MED ORDER — OXYCODONE HCL 5 MG/5ML PO SOLN: 5.0000 mg | Freq: Once | ORAL | Status: DC | PRN

## 2012-12-06 MED ORDER — ALPRAZOLAM 0.5 MG PO TABS: 1.0000 mg | ORAL_TABLET | Freq: Every evening | ORAL | Status: DC | PRN

## 2012-12-06 MED ORDER — OXYCODONE-ACETAMINOPHEN 5-325 MG PO TABS
1.0000 | ORAL_TABLET | ORAL | Status: DC | PRN
  Administered 2012-12-06 – 2012-12-07 (×3): 2 via ORAL
  Filled 2012-12-06 (×3): qty 2

## 2012-12-06 MED ORDER — ONDANSETRON HCL 4 MG/2ML IJ SOLN
INTRAMUSCULAR | Status: DC | PRN
  Administered 2012-12-06: 4 mg via INTRAVENOUS

## 2012-12-06 MED ORDER — MEPERIDINE HCL 25 MG/ML IJ SOLN: 6.2500 mg | INTRAMUSCULAR | Status: DC | PRN

## 2012-12-06 MED ORDER — HYDROMORPHONE HCL PF 1 MG/ML IJ SOLN
INTRAMUSCULAR | Status: AC
  Filled 2012-12-06: qty 1

## 2012-12-06 MED ORDER — LIDOCAINE HCL (CARDIAC) 20 MG/ML IV SOLN
INTRAVENOUS | Status: DC | PRN
  Administered 2012-12-06: 80 mg via INTRAVENOUS

## 2012-12-06 MED ORDER — LACTATED RINGERS IV SOLN
INTRAVENOUS | Status: DC
  Administered 2012-12-06: 11:00:00 via INTRAVENOUS

## 2012-12-06 MED ORDER — BUPIVACAINE-EPINEPHRINE PF 0.25-1:200000 % IJ SOLN
INTRAMUSCULAR | Status: AC
  Filled 2012-12-06: qty 30

## 2012-12-06 MED ORDER — FENTANYL CITRATE 0.05 MG/ML IJ SOLN
INTRAMUSCULAR | Status: DC | PRN
  Administered 2012-12-06: 50 ug via INTRAVENOUS
  Administered 2012-12-06: 150 ug via INTRAVENOUS
  Administered 2012-12-06: 50 ug via INTRAVENOUS

## 2012-12-06 MED ORDER — SODIUM CHLORIDE 0.9 % IV SOLN
INTRAVENOUS | Status: DC | PRN
  Administered 2012-12-06: 14:00:00

## 2012-12-06 MED ORDER — CEFAZOLIN SODIUM-DEXTROSE 2-3 GM-% IV SOLR
INTRAVENOUS | Status: AC
  Filled 2012-12-06: qty 50

## 2012-12-06 MED ORDER — HYDROMORPHONE HCL PF 1 MG/ML IJ SOLN
INTRAMUSCULAR | Status: AC
Start: 2012-12-06 — End: 2012-12-07
  Filled 2012-12-06: qty 1

## 2012-12-06 MED ORDER — TAMSULOSIN HCL 0.4 MG PO CAPS
0.4000 mg | ORAL_CAPSULE | Freq: Every day | ORAL | Status: DC
  Administered 2012-12-06: 0.4 mg via ORAL
  Filled 2012-12-06 (×2): qty 1

## 2012-12-06 MED ORDER — PROPOFOL 10 MG/ML IV BOLUS
INTRAVENOUS | Status: DC | PRN
  Administered 2012-12-06: 200 mg via INTRAVENOUS

## 2012-12-06 MED ORDER — OXYCODONE-ACETAMINOPHEN 5-325 MG PO TABS: 1.0000 | ORAL_TABLET | ORAL | Status: DC | PRN

## 2012-12-06 MED ORDER — ROCURONIUM BROMIDE 100 MG/10ML IV SOLN
INTRAVENOUS | Status: DC | PRN
  Administered 2012-12-06: 20 mg via INTRAVENOUS
  Administered 2012-12-06: 30 mg via INTRAVENOUS

## 2012-12-06 MED ORDER — EPHEDRINE SULFATE 50 MG/ML IJ SOLN
INTRAMUSCULAR | Status: DC | PRN
  Administered 2012-12-06 (×2): 10 mg via INTRAVENOUS

## 2012-12-06 MED ORDER — PROMETHAZINE HCL 25 MG/ML IJ SOLN
INTRAMUSCULAR | Status: AC
  Filled 2012-12-06: qty 1

## 2012-12-06 MED ORDER — NEOSTIGMINE METHYLSULFATE 1 MG/ML IJ SOLN
INTRAMUSCULAR | Status: DC | PRN
  Administered 2012-12-06: 4 mg via INTRAVENOUS

## 2012-12-06 MED ORDER — PROMETHAZINE HCL 25 MG PO TABS: 25.0000 mg | ORAL_TABLET | ORAL | Status: DC | PRN

## 2012-12-06 MED ORDER — PROMETHAZINE HCL 25 MG/ML IJ SOLN
6.2500 mg | INTRAMUSCULAR | Status: DC | PRN
  Administered 2012-12-06: 6.25 mg via INTRAVENOUS

## 2012-12-06 MED ORDER — CHLORHEXIDINE GLUCONATE 4 % EX LIQD: 1.0000 "application " | Freq: Once | CUTANEOUS | Status: DC

## 2012-12-06 MED ORDER — SODIUM CHLORIDE 0.9 % IR SOLN
Status: DC | PRN
  Administered 2012-12-06: 1000 mL

## 2012-12-06 MED ORDER — HYDROMORPHONE HCL PF 1 MG/ML IJ SOLN
0.2500 mg | INTRAMUSCULAR | Status: DC | PRN
  Administered 2012-12-06 (×4): 0.5 mg via INTRAVENOUS

## 2012-12-06 MED ORDER — ONDANSETRON HCL 4 MG/2ML IJ SOLN: 4.0000 mg | Freq: Four times a day (QID) | INTRAMUSCULAR | Status: DC | PRN

## 2012-12-06 MED ORDER — LACTATED RINGERS IV SOLN
INTRAVENOUS | Status: DC | PRN
  Administered 2012-12-06 (×2): via INTRAVENOUS

## 2012-12-06 MED ORDER — KCL IN DEXTROSE-NACL 20-5-0.9 MEQ/L-%-% IV SOLN
INTRAVENOUS | Status: DC
  Administered 2012-12-06: 19:00:00 via INTRAVENOUS
  Filled 2012-12-06 (×3): qty 1000

## 2012-12-06 MED ORDER — HYDROMORPHONE HCL PF 1 MG/ML IJ SOLN
0.5000 mg | INTRAMUSCULAR | Status: AC | PRN
  Administered 2012-12-06 (×4): 0.5 mg via INTRAVENOUS

## 2012-12-06 MED ORDER — FENTANYL CITRATE 0.05 MG/ML IJ SOLN
50.0000 ug | INTRAMUSCULAR | Status: DC | PRN
  Administered 2012-12-07: 50 ug via INTRAVENOUS
  Filled 2012-12-06: qty 2

## 2012-12-06 MED ORDER — HEPARIN SODIUM (PORCINE) 5000 UNIT/ML IJ SOLN
5000.0000 [IU] | Freq: Three times a day (TID) | INTRAMUSCULAR | Status: DC
  Administered 2012-12-07: 5000 [IU] via SUBCUTANEOUS
  Filled 2012-12-06 (×4): qty 1

## 2012-12-06 MED ORDER — ONDANSETRON HCL 4 MG PO TABS: 4.0000 mg | ORAL_TABLET | Freq: Four times a day (QID) | ORAL | Status: DC | PRN

## 2012-12-06 MED ORDER — MIDAZOLAM HCL 5 MG/5ML IJ SOLN
INTRAMUSCULAR | Status: DC | PRN
  Administered 2012-12-06: 4 mg via INTRAVENOUS

## 2012-12-06 MED ORDER — HYDROMORPHONE HCL PF 1 MG/ML IJ SOLN
INTRAMUSCULAR | Status: DC | PRN
  Administered 2012-12-06: .25 mg via INTRAVENOUS

## 2012-12-06 MED ORDER — BUPIVACAINE-EPINEPHRINE 0.25% -1:200000 IJ SOLN
INTRAMUSCULAR | Status: DC | PRN
  Administered 2012-12-06: 25 mL

## 2012-12-06 MED ORDER — 0.9 % SODIUM CHLORIDE (POUR BTL) OPTIME
TOPICAL | Status: DC | PRN
  Administered 2012-12-06: 1000 mL

## 2012-12-06 MED ORDER — OXYCODONE-ACETAMINOPHEN 10-325 MG PO TABS: 1.0000 | ORAL_TABLET | ORAL | Status: DC | PRN

## 2012-12-06 MED ORDER — PHENYLEPHRINE HCL 10 MG/ML IJ SOLN
INTRAMUSCULAR | Status: DC | PRN
  Administered 2012-12-06: 80 ug via INTRAVENOUS

## 2012-12-06 SURGICAL SUPPLY — 43 items
APPLIER CLIP ROT 10 11.4 M/L (STAPLE) ×2
BLADE SURG ROTATE 9660 (MISCELLANEOUS) IMPLANT
CANISTER SUCTION 2500CC (MISCELLANEOUS) ×2 IMPLANT
CATH REDDICK CHOLANGI 4FR 50CM (CATHETERS) ×2 IMPLANT
CHLORAPREP W/TINT 26ML (MISCELLANEOUS) ×2 IMPLANT
CLIP APPLIE ROT 10 11.4 M/L (STAPLE) ×1 IMPLANT
COVER MAYO STAND STRL (DRAPES) ×2 IMPLANT
COVER SURGICAL LIGHT HANDLE (MISCELLANEOUS) ×2 IMPLANT
DECANTER SPIKE VIAL GLASS SM (MISCELLANEOUS) IMPLANT
DERMABOND ADVANCED (GAUZE/BANDAGES/DRESSINGS) ×1
DERMABOND ADVANCED .7 DNX12 (GAUZE/BANDAGES/DRESSINGS) ×1 IMPLANT
DRAPE C-ARM 42X72 X-RAY (DRAPES) ×2 IMPLANT
DRAPE UTILITY 15X26 W/TAPE STR (DRAPE) ×4 IMPLANT
ELECT REM PT RETURN 9FT ADLT (ELECTROSURGICAL) ×2
ELECTRODE REM PT RTRN 9FT ADLT (ELECTROSURGICAL) ×1 IMPLANT
GLOVE BIO SURGEON STRL SZ7.5 (GLOVE) ×2 IMPLANT
GLOVE BIO SURGEON STRL SZ8 (GLOVE) ×2 IMPLANT
GLOVE BIOGEL PI IND STRL 7.0 (GLOVE) ×1 IMPLANT
GLOVE BIOGEL PI IND STRL 8 (GLOVE) ×1 IMPLANT
GLOVE BIOGEL PI INDICATOR 7.0 (GLOVE) ×1
GLOVE BIOGEL PI INDICATOR 8 (GLOVE) ×1
GLOVE SURG SIGNA 7.5 PF LTX (GLOVE) ×2 IMPLANT
GLOVE SURG SS PI 7.0 STRL IVOR (GLOVE) ×2 IMPLANT
GOWN PREVENTION PLUS XLARGE (GOWN DISPOSABLE) ×2 IMPLANT
GOWN STRL NON-REIN LRG LVL3 (GOWN DISPOSABLE) ×4 IMPLANT
GOWN STRL REIN XL XLG (GOWN DISPOSABLE) ×2 IMPLANT
IV CATH 14GX2 1/4 (CATHETERS) ×2 IMPLANT
KIT BASIN OR (CUSTOM PROCEDURE TRAY) ×2 IMPLANT
KIT ROOM TURNOVER OR (KITS) ×2 IMPLANT
NS IRRIG 1000ML POUR BTL (IV SOLUTION) ×2 IMPLANT
PAD ARMBOARD 7.5X6 YLW CONV (MISCELLANEOUS) ×2 IMPLANT
POUCH SPECIMEN RETRIEVAL 10MM (ENDOMECHANICALS) ×2 IMPLANT
SCISSORS LAP 5X35 DISP (ENDOMECHANICALS) IMPLANT
SET IRRIG TUBING LAPAROSCOPIC (IRRIGATION / IRRIGATOR) ×2 IMPLANT
SLEEVE ENDOPATH XCEL 5M (ENDOMECHANICALS) ×2 IMPLANT
SPECIMEN JAR SMALL (MISCELLANEOUS) ×2 IMPLANT
SUT MNCRL AB 4-0 PS2 18 (SUTURE) ×2 IMPLANT
TOWEL OR 17X24 6PK STRL BLUE (TOWEL DISPOSABLE) ×2 IMPLANT
TOWEL OR 17X26 10 PK STRL BLUE (TOWEL DISPOSABLE) ×2 IMPLANT
TRAY LAPAROSCOPIC (CUSTOM PROCEDURE TRAY) ×2 IMPLANT
TROCAR XCEL BLUNT TIP 100MML (ENDOMECHANICALS) ×2 IMPLANT
TROCAR XCEL NON-BLD 11X100MML (ENDOMECHANICALS) ×2 IMPLANT
TROCAR XCEL NON-BLD 5MMX100MML (ENDOMECHANICALS) ×2 IMPLANT

## 2012-12-06 NOTE — Op Note (Signed)
12/06/2012  2:32 PM  PATIENT:  Mary Morse  32 y.o. female  PRE-OPERATIVE DIAGNOSIS:  ADENOMYATOSIS OF GALLBLADDER  POST-OPERATIVE DIAGNOSIS:  ADENOMYATOSIS OF GALLBLADDER  PROCEDURE:  Procedure(s): LAPAROSCOPIC CHOLECYSTECTOMY WITH INTRAOPERATIVE CHOLANGIOGRAM (N/A)  SURGEON:  Surgeon(s) and Role:    * Shelly Rubenstein, MD - Assisting    * Robyne Askew, MD - Primary  PHYSICIAN ASSISTANT:   ASSISTANTS: Dr. Magnus Ivan   ANESTHESIA:   general  EBL:     BLOOD ADMINISTERED:none  DRAINS: none   LOCAL MEDICATIONS USED:  MARCAINE     SPECIMEN:  Source of Specimen:  gallbladder  DISPOSITION OF SPECIMEN:  PATHOLOGY  COUNTS:  YES  TOURNIQUET:  * No tourniquets in log *  DICTATION: .Dragon Dictation  Procedure: After informed consent was obtained the patient was brought to the operating room and placed in the supine position on the operating room table. After adequate induction of general anesthesia the patient's abdomen was prepped with ChloraPrep allowed to dry and draped in usual sterile manner. The area below the umbilicus was infiltrated with quarter percent  Marcaine. A small incision was made with a 15 blade knife. The incision was carried down through the subcutaneous tissue bluntly with a hemostat and [employer] retractors. The linea alba was identified. The linea alba was incised with a 15 blade knife and each side was grasped with Coker clamps. The preperitoneal space was then probed with a hemostat until the peritoneum was opened and access was gained to the abdominal cavity. A 0 Vicryl pursestring stitch was placed in the fascia surrounding the opening. A Hassan cannula was then placed through the opening and anchored in place with the previously placed Vicryl purse string stitch. The abdomen was insufflated with carbon dioxide without difficulty. A laparoscope was inserted through the [facility] cannula in the right upper quadrant was inspected. Next the epigastric region  was infiltrated with % Marcaine. A small incision was made with a 15 blade knife. A 10 mm port was placed bluntly through this incision into the abdominal cavity under direct vision. Next 2 sites were chosen laterally on the right side of the abdomen for placement of 5 mm ports. Each of these areas was infiltrated with quarter percent Marcaine. Small stab incisions were made with a 15 blade knife. 5 mm ports were then placed bluntly through these incisions into the abdominal cavity under direct vision without difficulty. A blunt grasper was placed through the lateralmost 5 mm port and used to grasp the dome of the gallbladder and elevated anteriorly and superiorly. Another blunt grasper was placed through the other 5 mm port and used to retract the body and neck of the gallbladder. A dissector was placed through the epigastric port and using the electrocautery the peritoneal reflection at the gallbladder neck was opened. Blunt dissection was then carried out in this area until the gallbladder neck-cystic duct junction was readily identified and a good window was created. A single clip was placed on the gallbladder neck. A small  ductotomy was made just below the clip with laparoscopic scissors. A 14-gauge Angiocath was then placed through the anterior abdominal wall under direct vision. A Reddick cholangiogram catheter was then placed through the Angiocath and flushed. The catheter was then placed in the cystic duct and anchored in place with a clip. A cholangiogram was obtained that showed no filling defects good emptying into the duodenum an adequate length on the cystic duct. The anchoring clip and catheters were then removed from  the patient. 3 clips were placed proximally on the cystic duct and the duct was divided between the 2 sets of clips. Posterior to this the cystic artery was identified and again dissected bluntly in a circumferential manner until a good window  was created. 2 clips were placed  proximally and one distally on the artery and the artery was divided between the 2 sets of clips. Next a laparoscopic hook cautery device was used to separate the gallbladder from the liver bed. Prior to completely detaching the gallbladder from the liver bed the liver bed was inspected and several small bleeding points were coagulated with the electrocautery until the area was completely hemostatic. The gallbladder was then detached the rest of it from the liver bed without difficulty. A laparoscopic bag was inserted through the epigastric port. The gallbladder was placed within the bag and the bag was sealed. A laparoscope was then moved to the epigastric port. The gallbladder grasper was placed through the [facility] cannula and used to grasp the opening of the bag. The bag with the gallbladder was then removed with the [facility] cannula through the infraumbilical port without difficulty. The fascial defect was then closed with the previously placed Vicryl pursestring stitch as well as with another figure-of-eight 0 Vicryl stitch. The liver bed was inspected again and found to be hemostatic. The abdomen was irrigated with copious amounts of saline until the effluent was clear. The ports were then removed under direct vision without difficulty and were found to be hemostatic. The gas was allowed to escape. The skin incisions were all closed with interrupted 4-0 Monocryl subcuticular stitches. Dermabond dressings were applied. The patient tolerated the procedure well. At the end of the case all needle sponge and instrument counts were correct. The patient was then awakened and taken to recovery in stable condition  PLAN OF CARE: Discharge to home after PACU  PATIENT DISPOSITION:  PACU - hemodynamically stable.   Delay start of Pharmacological VTE agent (>24hrs) due to surgical blood loss or risk of bleeding: not applicable

## 2012-12-06 NOTE — H&P (View-Only) (Signed)
Patient ID: Mary Morse, female   DOB: 06/01/1980, 32 y.o.   MRN: [id number]  Chief Complaint  Patient presents with  . New Evaluation    eval GB/ pancreas    HPI Mary Morse is a 32 y.o. female.  We're asked to see the patient in consultation by Dr. Johnson at [facility] in [place] to evaluate her for gallstones. The patient is a 32-year-old white female who has been complaining of lower abnormal pain on both sides for the last month. She has had nausea and several episodes of vomiting. As part of her workup she underwent an ultrasound in the office at her medical doctors practice that apparently showed some inflammatory change of her gallbladder wall. It did not identify any gallstones. At that time she also had some slight elevation of her lipase. Her most recent lab work several days ago showed normal liver functions and a normal lipase. She continues to have constant lower abdominal pain. HPI  Past Medical History  Diagnosis Date  . Migraine   . Kidney stone     Past Surgical History  Procedure Laterality Date  . Abdominal hysterectomy    . Breast reduction surgery    . Tubal ligation      Family History  Problem Relation Age of Onset  . Cancer Father     prostate  . Cancer Paternal Grandmother     breast    Social History History  Substance Use Topics  . Smoking status: Never Smoker   . Smokeless tobacco: Never Used  . Alcohol Use: No    No Known Allergies  Current Outpatient Prescriptions  Medication Sig Dispense Refill  . ALPRAZolam (XANAX) 1 MG tablet Take 1 mg by mouth at bedtime as needed for anxiety.      . Linaclotide (LINZESS) 290 MCG CAPS capsule Take 290 mcg by mouth daily.      . oxyCODONE-acetaminophen (PERCOCET) 10-325 MG per tablet Take 1-2 tablets by mouth every 4 (four) hours as needed for pain.  30 tablet  0  . promethazine (PHENERGAN) 25 MG tablet Take 25 mg by mouth as needed for nausea.       . tamsulosin (FLOMAX) 0.4 MG  CAPS capsule Take 1 capsule (0.4 mg total) by mouth daily after supper.  30 capsule  11  . phentermine 37.5 MG capsule Take 37.5 mg by mouth every morning.       No current facility-administered medications for this visit.    Review of Systems Review of Systems  Constitutional: Negative.   HENT: Negative.   Eyes: Negative.   Respiratory: Negative.   Cardiovascular: Negative.   Gastrointestinal: Positive for nausea and abdominal pain.  Endocrine: Negative.   Genitourinary: Negative.   Musculoskeletal: Negative.   Skin: Negative.   Allergic/Immunologic: Negative.   Neurological: Negative.   Hematological: Negative.   Psychiatric/Behavioral: Negative.     Blood pressure 118/70, pulse 64, temperature 98.4 F (36.9 C), temperature source Temporal, resp. rate 14, height 5' 1" (1.549 m), weight 182 lb 12.8 oz (82.918 kg).  Physical Exam Physical Exam  Constitutional: She is oriented to person, place, and time. She appears well-developed and well-nourished.  HENT:  Head: Normocephalic and atraumatic.  Eyes: Conjunctivae and EOM are normal. Pupils are equal, round, and reactive to light.  Neck: Normal range of motion. Neck supple.  Cardiovascular: Normal rate, regular rhythm and normal heart sounds.   Pulmonary/Chest: Effort normal and breath sounds normal.  Abdominal: Soft. Bowel sounds are normal.    There is mild to moderate lower abdominal pain with no guarding or peritonitis. There is no palpable mass. There is also some mild right upper quadrant tenderness.  Musculoskeletal: Normal range of motion.  Neurological: She is alert and oriented to person, place, and time.  Skin: Skin is warm and dry.  Psychiatric: She has a normal mood and affect. Her behavior is normal.    Data Reviewed As above  Assessment    It sounds as though the patient may have had an episode of gallstone pancreatitis although her previous ultrasound did not show any gallstones. Although her lab work is  all normal if she continues to have lower abdominal pain and some mild right upper quadrant pain. Since her finding seemed to be discordant with her exam I think it would be reasonable to repeat her abdominal ultrasound locally with our radiologist.     Plan    If she does have evidence of biliary disease and I think she would probably benefit from having her gallbladder removed. I think she would be a good candidate for laparoscopic cholecystectomy. I have discussed with her in detail the risks and benefits of the operation to remove the gallbladder as well as some of the technical aspects and she understands and wishes to proceed. She also understands that I cannot guarantee that removing her gallbladder will take all of her pains away since her pains are atypical.        TOTH III,PAUL S 11/14/2012, 1:20 PM    

## 2012-12-06 NOTE — Anesthesia Preprocedure Evaluation (Addendum)
Anesthesia Evaluation  Patient identified by MRN, date of birth, ID band Patient awake    Reviewed: Allergy & Precautions, H&P , NPO status , Patient's Chart, lab work & pertinent test results  History of Anesthesia Complications Negative for: history of anesthetic complications  Airway Mallampati: II TM Distance: >3 FB Neck ROM: Full  Mouth opening: Limited Mouth Opening  Dental  (+) Teeth Intact and Dental Advisory Given   Pulmonary neg pulmonary ROS,  breath sounds clear to auscultation  Pulmonary exam normal       Cardiovascular negative cardio ROS  Rhythm:Regular Rate:Normal     Neuro/Psych  Headaches (migraines),    GI/Hepatic Neg liver ROS, N/v with gallbladder   Endo/Other  diabetes (diet controlled)Morbid obesity  Renal/GU negative Renal ROS     Musculoskeletal   Abdominal (+) + obese,   Peds  Hematology   Anesthesia Other Findings   Reproductive/Obstetrics S/p hysterectomy                          Anesthesia Physical Anesthesia Plan  ASA: II  Anesthesia Plan: General   Post-op Pain Management:    Induction: Intravenous  Airway Management Planned: Oral ETT  Additional Equipment:   Intra-op Plan:   Post-operative Plan: Extubation in OR  Informed Consent: I have reviewed the patients History and Physical, chart, labs and discussed the procedure including the risks, benefits and alternatives for the proposed anesthesia with the patient or authorized representative who has indicated his/her understanding and acceptance.   Dental advisory given  Plan Discussed with: CRNA and Surgeon  Anesthesia Plan Comments: (Plan routine monitors, GETA)        Anesthesia Quick Evaluation

## 2012-12-06 NOTE — Interval H&P Note (Signed)
History and Physical Interval Note:  12/06/2012 1:10 PM  Mary Morse  has presented today for surgery, with the diagnosis of adenomyatosis of gallbladder  The various methods of treatment have been discussed with the patient and family. After consideration of risks, benefits and other options for treatment, the patient has consented to  Procedure(s): LAPAROSCOPIC CHOLECYSTECTOMY WITH INTRAOPERATIVE CHOLANGIOGRAM (N/A) as a surgical intervention .  The patient's history has been reviewed, patient examined, no change in status, stable for surgery.  I have reviewed the patient's chart and labs.  Questions were answered to the patient's satisfaction.     TOTH III,PAUL S

## 2012-12-06 NOTE — Transfer of Care (Signed)
Immediate Anesthesia Transfer of Care Note  Patient: Mary Morse  Procedure(s) Performed: Procedure(s): LAPAROSCOPIC CHOLECYSTECTOMY WITH INTRAOPERATIVE CHOLANGIOGRAM (N/A)  Patient Location: PACU  Anesthesia Type:General  Level of Consciousness: sedated  Airway & Oxygen Therapy: Patient Spontanous Breathing and Patient connected to face mask oxygen  Post-op Assessment: Report given to PACU RN, Post -op Vital signs reviewed and stable and Patient moving all extremities X 4  Post vital signs: Reviewed and stable  Complications: No apparent anesthesia complications

## 2012-12-07 MED ORDER — FLUCONAZOLE 150 MG PO TABS: 150.0000 mg | ORAL_TABLET | Freq: Once | ORAL | Status: DC

## 2012-12-07 NOTE — Progress Notes (Signed)
1 Day Post-Op  Subjective: Complains of generalized abdominal pain but it seems to be improving. No nausea  Objective: Vital signs in last 24 hours: Temp:  [97 F (36.1 C)-99 F (37.2 C)] 99 F (37.2 C) (11/14 0600) Pulse Rate:  [60-92] 64 (11/14 0600) Resp:  [15-24] 16 (11/14 0600) BP: (94-123)/(47-67) 97/54 mmHg (11/14 0600) SpO2:  [97 %-100 %] 100 % (11/14 0600) Weight:  [179 lb (81.194 kg)] 179 lb (81.194 kg) (11/13 1118) Last BM Date: 12/06/12  Intake/Output from previous day: 11/13 0701 - 11/14 0700 In: 3200 [P.O.:600; I.V.:2500] Out: -  Intake/Output this shift:    Resp: clear to auscultation bilaterally Cardio: regular rate and rhythm GI: soft, appropriately tender. incisions ok  Lab Results:   Recent Labs  12/06/12 1116 12/06/12 1933  WBC 9.7 11.2*  HGB 11.9* 12.2  HCT 36.8 36.6  PLT 174 154   BMET  Recent Labs  12/06/12 1933  CREATININE 0.68   PT/INR No results found for this basename: LABPROT, INR,  in the last 72 hours ABG No results found for this basename: PHART, PCO2, PO2, HCO3,  in the last 72 hours  Studies/Results: Dg Cholangiogram Operative  12/06/2012   CLINICAL DATA:  History of cholecystectomy. Operative cholangiogram.  EXAM: INTRAOPERATIVE CHOLANGIOGRAM  TECHNIQUE: Cholangiographic images from the C-arm fluoroscopic device were submitted for interpretation post-operatively. Please see the procedural report for the amount of contrast.  Fluoroscopic time:  10 seconds  COMPARISON:  None.  FINDINGS: Intraoperative cholangiogram shows distention of the biliary tree. No definite choledocholithiasis is seen. No irregularity, mass, or obstruction is seen. There is flow of contrast into the duodenum.  IMPRESSION: No definite choledocholithiasis is seen. No irregularity, mass, or obstruction is seen. There is flow of contrast into the duodenum.   Electronically Signed   By: Onalee Hua  Call M.D.   On: 12/06/2012 14:22     Anti-infectives: Anti-infectives   Start     Dose/Rate Route Frequency Ordered Stop   12/07/12 0830  fluconazole (DIFLUCAN) tablet 150 mg  Status:  Discontinued     150 mg Oral  Once 12/07/12 0828 12/07/12 0830   12/06/12 1109  ceFAZolin (ANCEF) 2-3 GM-% IVPB SOLR    Comments:  Lenox Ahr   : cabinet override      12/06/12 1109 12/06/12 2314   12/06/12 0600  ceFAZolin (ANCEF) IVPB 2 g/50 mL premix     2 g 100 mL/hr over 30 Minutes Intravenous On call to O.R. 12/05/12 1406 12/06/12 1333      Assessment/Plan: s/p Procedure(s): LAPAROSCOPIC CHOLECYSTECTOMY WITH INTRAOPERATIVE CHOLANGIOGRAM (N/A) Discharge  LOS: 1 day    TOTH III,PAUL S 12/07/2012

## 2012-12-07 NOTE — Progress Notes (Signed)
Patient d/c to home this afternoon with family.  IV removed, instructions given and reviewed, prescription given.

## 2012-12-07 NOTE — Discharge Summary (Signed)
Physician Discharge Summary  Patient ID: Mary Morse MRN: [id number] DOB/AGE: [birth year] 33 y.o.  Admit date: 12/06/2012 Discharge date: 12/07/2012  Admission Diagnoses:  Discharge Diagnoses:  Active Problems:   * No active hospital problems. *   Discharged Condition: good  Hospital Course: the pt underwent lap chole. Postop she had some problems with pain control so she stayed overnight. She seems to be improving now and is ready to go home.  Consults: None  Significant Diagnostic Studies: none  Treatments: surgery: lap chole with ioc  Discharge Exam: Blood pressure 97/54, pulse 64, temperature 99 F (37.2 C), temperature source Oral, resp. rate 16, height 5\' 1"  (1.549 m), weight 179 lb (81.194 kg), SpO2 100.00%. GI: soft but tender. incisions ok  Disposition: 01-Home or Self Care  Discharge Orders   Future Orders Complete By Expires   Call MD for:  difficulty breathing, headache or visual disturbances  As directed    Call MD for:  difficulty breathing, headache or visual disturbances  As directed    Call MD for:  extreme fatigue  As directed    Call MD for:  extreme fatigue  As directed    Call MD for:  hives  As directed    Call MD for:  hives  As directed    Call MD for:  persistant dizziness or light-headedness  As directed    Call MD for:  persistant dizziness or light-headedness  As directed    Call MD for:  persistant nausea and vomiting  As directed    Call MD for:  persistant nausea and vomiting  As directed    Call MD for:  redness, tenderness, or signs of infection (pain, swelling, redness, odor or green/yellow discharge around incision site)  As directed    Call MD for:  redness, tenderness, or signs of infection (pain, swelling, redness, odor or green/yellow discharge around incision site)  As directed    Call MD for:  severe uncontrolled pain  As directed    Call MD for:  severe uncontrolled pain  As directed    Call MD for:  temperature >100.4  As  directed    Call MD for:  temperature >100.4  As directed    Diet - low sodium heart healthy  As directed    Diet - low sodium heart healthy  As directed    Discharge instructions  As directed    Comments:     May shower. No heavy lifting. Low fat diet   Discharge instructions  As directed    Comments:     May shower. No heavy lifting. Low fat diet. Use colace and miralax to avoid constipation   Increase activity slowly  As directed    Increase activity slowly  As directed    No wound care  As directed    No wound care  As directed        Medication List         ALPRAZolam 1 MG tablet  Commonly known as:  XANAX  Take 1 mg by mouth at bedtime as needed for anxiety.     LINZESS 145 MCG Caps capsule  Generic drug:  Linaclotide  Take 145 mcg by mouth every morning.     oxyCODONE-acetaminophen 10-325 MG per tablet  Commonly known as:  PERCOCET  Take 1-2 tablets by mouth every 4 (four) hours as needed for pain.     oxyCODONE-acetaminophen 5-325 MG per tablet  Commonly known as:  ROXICET  Take 1-2  tablets by mouth every 4 (four) hours as needed for severe pain.     phentermine 37.5 MG capsule  Take 37.5 mg by mouth daily.     promethazine 25 MG tablet  Commonly known as:  PHENERGAN  Take 25 mg by mouth as needed for nausea.     tamsulosin 0.4 MG Caps capsule  Commonly known as:  FLOMAX  Take 0.4 mg by mouth at bedtime.           Follow-up Information   Follow up with TOTH III,PAUL S, MD In 3 weeks.   Specialty:  General Surgery   Contact information:   [address] [place] [address] [phone]       Signed: Robyne Askew 12/07/2012, 8:38 AM

## 2012-12-07 NOTE — Anesthesia Postprocedure Evaluation (Signed)
  Anesthesia Post-op Note  Patient: Mary Morse  Procedure(s) Performed: Procedure(s): LAPAROSCOPIC CHOLECYSTECTOMY WITH INTRAOPERATIVE CHOLANGIOGRAM (N/A)  Patient Location: Nursing Unit  Anesthesia Type:General  Level of Consciousness: awake, alert  and oriented  Airway and Oxygen Therapy: Patient Spontanous Breathing  Post-op Pain: none  Post-op Assessment: Post-op Vital signs reviewed, Patient's Cardiovascular Status Stable, Respiratory Function Stable, Patent Airway, No signs of Nausea or vomiting, Adequate PO intake and Pain level controlled  Post-op Vital Signs: Reviewed and stable  Complications: No apparent anesthesia complications

## 2012-12-07 NOTE — Progress Notes (Signed)
   CARE MANAGEMENT NOTE 12/07/2012  Patient:  Mary Morse, Mary Morse   Account Number:  [id number]  Date Initiated:  12/07/2012  Documentation initiated by:  [facility]  Subjective/Objective Assessment:   LAPAROSCOPIC CHOLECYSTECTOMY     Action/Plan:   no NCM needs identified.   Anticipated DC Date:  12/07/2012   Anticipated DC Plan:  HOME/SELF CARE      DC Planning Services  CM consult      Choice offered to / List presented to:             Status of service:  Completed, signed off Medicare Important Message given?   (If response is "NO", the following Medicare IM given date fields will be blank) Date Medicare IM given:   Date Additional Medicare IM given:    Discharge Disposition:  HOME/SELF CARE  Per UR Regulation:    If discussed at Long Length of Stay Meetings, dates discussed:    Comments:

## 2012-12-10 ENCOUNTER — Encounter (HOSPITAL_COMMUNITY): Payer: Self-pay | Admitting: General Surgery

## 2012-12-11 ENCOUNTER — Telehealth (INDEPENDENT_AMBULATORY_CARE_PROVIDER_SITE_OTHER): Payer: Self-pay

## 2012-12-11 ENCOUNTER — Encounter (INDEPENDENT_AMBULATORY_CARE_PROVIDER_SITE_OTHER): Payer: Self-pay | Admitting: *Deleted

## 2012-12-11 NOTE — Telephone Encounter (Signed)
Message copied by Brennan Bailey on Tue Dec 11, 2012  2:07 PM ------      Message from: [facility]      Created: Mon Dec 10, 2012 11:54 AM      Contact: [phone]       She had surgery last Thursday and needs to know when she can go back to work  ------

## 2012-12-11 NOTE — Telephone Encounter (Signed)
LMOM to call back. 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 If she does any lifting she needs to wait until she is 2 weeks post op. RTW note to be provided when she calls back.

## 2012-12-11 NOTE — Telephone Encounter (Signed)
Return to work note written for patient to go back on 12/26/12 with PO appt made for 12/25/12.  2 weeks falls on the holiday so made for the following week.  Letter faxed to [facility] in [place] @ [phone].  Confirmation received for fax.

## 2012-12-25 ENCOUNTER — Encounter (INDEPENDENT_AMBULATORY_CARE_PROVIDER_SITE_OTHER): Payer: Self-pay | Admitting: General Surgery

## 2012-12-25 ENCOUNTER — Telehealth (INDEPENDENT_AMBULATORY_CARE_PROVIDER_SITE_OTHER): Payer: Self-pay | Admitting: General Surgery

## 2012-12-25 ENCOUNTER — Ambulatory Visit (INDEPENDENT_AMBULATORY_CARE_PROVIDER_SITE_OTHER): Payer: BC Managed Care – PPO | Admitting: General Surgery

## 2012-12-25 ENCOUNTER — Encounter (INDEPENDENT_AMBULATORY_CARE_PROVIDER_SITE_OTHER): Payer: Self-pay

## 2012-12-25 VITALS — BP 108/68 | HR 80 | Temp 98.0°F | Resp 16 | Ht 61.0 in | Wt 182.0 lb

## 2012-12-25 DIAGNOSIS — K8020 Calculus of gallbladder without cholecystitis without obstruction: Secondary | ICD-10-CM

## 2012-12-25 NOTE — Telephone Encounter (Signed)
[employer], pt's employer, called for clarification of "light duty" per the RTW she has received.  She is able to take our verbal clarification:  No pushing, pulling, lifting or carrying anything weighing more than 20 pounds.  She understands.

## 2012-12-25 NOTE — Patient Instructions (Signed)
May return to work light duty for next 3 weeks then normal activity

## 2012-12-28 NOTE — Progress Notes (Signed)
Subjective:     Patient ID: Mary Morse, female   DOB: 09/09/1980, 32 y.o.   MRN: [id number]  HPI The patient is a 32 year old female who is about 3 weeks status post laparoscopic cholecystectomy. She tolerated the surgery well. She denies any abdominal pain. Her appetite is good and her bowels are working normally.  Review of Systems     Objective:   Physical Exam On exam her abdomen is soft and nontender. Her incisions are all healing nicely with no sign of infection.    Assessment:     The patient is 3 weeks status post laparoscopic cholecystectomy     Plan:     At this point I would like to refrain from any heavy lifting for another 2-3 weeks and then she may return to her normal activities. We will plan to see her back on a when necessary basis.

## 2013-01-02 ENCOUNTER — Encounter (HOSPITAL_COMMUNITY): Payer: Self-pay | Admitting: General Surgery

## 2013-01-31 ENCOUNTER — Encounter (HOSPITAL_COMMUNITY): Payer: Self-pay

## 2013-01-31 ENCOUNTER — Other Ambulatory Visit: Payer: Self-pay | Admitting: Urology

## 2013-01-31 NOTE — Progress Notes (Signed)
Patient states has not received blue folder yet. Supposed to be in the mail. She was told about lax from office. If does not receive folder will call office. If unable to get it will arrive here early to complete

## 2013-02-04 ENCOUNTER — Encounter (HOSPITAL_COMMUNITY)
Admission: RE | Disposition: A | Discharge status: Home / Self Care | Payer: Self-pay | Source: Ambulatory Visit | Attending: Urology

## 2013-02-04 ENCOUNTER — Ambulatory Visit (HOSPITAL_COMMUNITY)
Admission: RE | Admit: 2013-02-04 | Discharge: 2013-02-04 | Disposition: A | Discharge status: Home / Self Care | Payer: BC Managed Care – PPO | Source: Ambulatory Visit | Attending: Urology | Admitting: Urology

## 2013-02-04 ENCOUNTER — Ambulatory Visit (HOSPITAL_COMMUNITY): Payer: BC Managed Care – PPO

## 2013-02-04 ENCOUNTER — Encounter (HOSPITAL_COMMUNITY): Payer: Self-pay | Admitting: General Practice

## 2013-02-04 DIAGNOSIS — N200 Calculus of kidney: Secondary | ICD-10-CM

## 2013-02-04 DIAGNOSIS — Z79899 Other long term (current) drug therapy: Secondary | ICD-10-CM | POA: Insufficient documentation

## 2013-02-04 DIAGNOSIS — N201 Calculus of ureter: Secondary | ICD-10-CM | POA: Insufficient documentation

## 2013-02-04 DIAGNOSIS — K219 Gastro-esophageal reflux disease without esophagitis: Secondary | ICD-10-CM | POA: Insufficient documentation

## 2013-02-04 DIAGNOSIS — G8929 Other chronic pain: Secondary | ICD-10-CM | POA: Insufficient documentation

## 2013-02-04 DIAGNOSIS — R109 Unspecified abdominal pain: Secondary | ICD-10-CM | POA: Insufficient documentation

## 2013-02-04 DIAGNOSIS — N390 Urinary tract infection, site not specified: Secondary | ICD-10-CM | POA: Insufficient documentation

## 2013-02-04 SURGERY — LITHOTRIPSY, ESWL
Anesthesia: LOCAL | Laterality: Right

## 2013-02-04 MED ORDER — DIAZEPAM 5 MG PO TABS
10.0000 mg | ORAL_TABLET | ORAL | Status: AC
  Administered 2013-02-04: 10 mg via ORAL
  Filled 2013-02-04: qty 2

## 2013-02-04 MED ORDER — OXYCODONE HCL 10 MG PO TABS: 10.0000 mg | ORAL_TABLET | ORAL | Status: DC | PRN

## 2013-02-04 MED ORDER — SODIUM CHLORIDE 0.9 % IV SOLN
INTRAVENOUS | Status: DC
  Administered 2013-02-04: 09:00:00 via INTRAVENOUS

## 2013-02-04 MED ORDER — OXYCODONE HCL 5 MG PO TABS
10.0000 mg | ORAL_TABLET | ORAL | Status: DC | PRN
  Administered 2013-02-04: 10 mg via ORAL
  Filled 2013-02-04: qty 2

## 2013-02-04 MED ORDER — CIPROFLOXACIN HCL 500 MG PO TABS
500.0000 mg | ORAL_TABLET | ORAL | Status: AC
  Administered 2013-02-04: 500 mg via ORAL
  Filled 2013-02-04: qty 1

## 2013-02-04 MED ORDER — DIPHENHYDRAMINE HCL 25 MG PO CAPS
25.0000 mg | ORAL_CAPSULE | ORAL | Status: AC
  Administered 2013-02-04: 25 mg via ORAL
  Filled 2013-02-04: qty 1

## 2013-02-04 MED ORDER — TAMSULOSIN HCL 0.4 MG PO CAPS: 0.4000 mg | ORAL_CAPSULE | Freq: Every day | ORAL | Status: DC

## 2013-02-04 NOTE — Op Note (Signed)
See [facility] OP note scanned into chart. 

## 2013-02-04 NOTE — H&P (Signed)
Reason For Visit bilateral groin pain   History of Present Illness This is a 33 year old female patient new to [facility] who was seen in Oct 2014 for a nonobstructing 8 mm right renal calculus noted on ultrasound as part of a workup for abdominal pain and elevated pancreatic enzymes. She also c/o SP pain associated w/ eating and was found to have cholecystitis and underwent cholecystectomy 2 weeks ago. 3 weeks ago on Nov 12, 2012 patient underwent right ESWL for the 8 mm right renal calculus. Patients had lithotripsy on her right side once before .    Interval hx:  Ms Mary Morse returns today w/ c/o bilateral groin pain that has been ongoing prior to recent ureteroscopy procedure. She denies fever/chills/nausea/vomiting. Pain is localized and has a quality of dull ache which is constant. Pain is not related to intercourse and does not seem to improve or worsen w/ eating. Pain improves w/ oxycodone PO, prn. She denies any other associated voiding or emptying symptoms or alleviating/aggravating factors.     Past Medical History Problems  1. History of esophageal reflux (V12.79)  Surgical History Problems  1. History of Breast Surgery Reduction Procedure 2. History of Hysterectomy 3. History of Lithotripsy 4. History of Tubal Ligation  Current Meds 1. Linzess 145 MCG Oral Capsule;  Therapy: 27Oct2014 to Recorded 2. OxyCODONE HCl - 10 MG Oral Tablet;  Therapy: 08QPY1950 to Recorded 3. Percocet 10-325 MG Oral Tablet;  Therapy: (Recorded:16Oct2014) to Recorded 4. Phenergan 25 MG TABS;  Therapy: (Recorded:16Oct2014) to Recorded 5. Phentermine HCl - 37.5 MG Oral Capsule;  Therapy: (Recorded:16Oct2014) to Recorded  Allergies Medication  1. No Known Drug Allergies  Family History Problems  1. Family history of Family Health Status Number Of Children : Father   1 son 2 daughters 2. Family history of Hematuria : Father 3. Family history of Prostate Cancer (D32.67) : Father  Social  History Problems  1. Denied: History of Alcohol Use 2. Caffeine Use 3. Marital History - Single 4. Never A Smoker  Review of Systems  Genitourinary: suprapubic pain and inguinal pain, but no urinary frequency, no urinary urgency, no dysuria, no nocturia, urinary stream does not start and stop and no hematuria.  Gastrointestinal: no nausea, no vomiting, no abdominal pain, no diarrhea and no constipation.    Vitals Vital Signs [Data Includes: Last 1 Day]  Recorded: [id number] 04:04PM  Height: 5 ft 1 in Weight: 179 lb  BMI Calculated: 33.82 BSA Calculated: 1.8 Blood Pressure: 93 / 62 Temperature: 97.8 F Heart Rate: 81  Physical Exam Constitutional: Well nourished and well developed . No acute distress.  ENT:. The ears and nose are normal in appearance.  Neck: The appearance of the neck is normal.  Pulmonary: No respiratory distress.  Cardiovascular:. The arterial pulses are normal.  Abdomen: The abdomen is soft and nontender. Moderate suprapubic tenderness is present. No CVA tenderness.    Results/Data  The following images/tracing/specimen were independently visualized:  today's KUB indicates stable right ureteral stone unchanged from post ESWL.  The following clinical lab reports were reviewed:  Ua + many bacteria but no WBC/ RBC. Selected Results  UA With REFLEX [id number] 03:46PM [name]  SPECIMEN TYPE: [place]   Test Name Result Flag Reference  COLOR YELLOW  YELLOW  APPEARANCE CLOUDY A CLEAR  SPECIFIC GRAVITY 1.030  1.005-1.030  pH 5.5  5.0-8.0  GLUCOSE NEG mg/dL  NEG  BILIRUBIN NEG  NEG  KETONE NEG mg/dL  NEG  BLOOD NEG  NEG  PROTEIN  NEG mg/dL  NEG  UROBILINOGEN 0.2 mg/dL  0.0-1.0  NITRITE NEG  NEG  LEUKOCYTE ESTERASE NEG  NEG  SQUAMOUS EPITHELIAL/HPF RARE  RARE  WBC 0-2 WBC/hpf  <3  RBC NONE SEEN RBC/hpf  <3  BACTERIA MODERATE A RARE  CRYSTALS   NONE SEEN  Calcium Oxalate crystals noted  CASTS NONE SEEN  NONE SEEN  Other MUCUS NOTED      Assessment Assessed  1. Urinary tract infection (599.0) 2. 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 Nephrolithiasis (592.0)  - UA + many bacteria : possible UTI  - KUB today indicates right proximal stone stable from prior to the ESWL procedure, unchanged.   Plan  Chronic groin pain  1. Changed: From  OxyCODONE HCl - 10 MG Oral Tablet  To OxyCODONE HCl - 10 MG  Oral Tablet TAKE 1 TABLET 3 times daily 2. Follow-up Office  Follow-up w Dr. Louis Meckel in 6 weeks after PT  Status: Hold For -  Appointment,Date of Service  Requested for: [id number] Health Maintenance  3. UA With REFLEX; Status:Complete;   Done: [id number] 03:46PM Urinary tract infection  4. URINE CULTURE; Status:In Progress - Specimen/Data Collected;   Done: [id number]  PT/OT Referral Referral Referral Status: Hold For - PreCert,Date of Service,Physical Therapy Requested for: [id number] Ordered;  For: Chronic groin pain; Ordered By: [name] Performed:  Due: [id number] Marked Important; Last Updated By: Jerlyn Ly; 01/01/2013 5:49:01 PM   Annotations       see Hart Robinsons   Discussion/Summary Dr. Louis Meckel met w/ patient today to inform her of the unsuccessful ESWL since there has been no resolution of the left ureteral stone after the procedure. In addition, her groin pain is not related to GU issue since the stone has been nonobstructing and there has been no hydronephrosis. Possible hernia was also mentioned as source of her pain but pt just had f/u w/ her general surgeon last week who did not indicate hernia.  Repeat ESWL was discussed w/ patient who would like to defer this procedure until a later time since the bilateral inguinal pain is most bothersome to her.    - Urine sample sent for culture due to pyuria on UA.  - Refilled w/ pain med oxcycodone 46m Q 4 hrs,# 30, 0 RF  - PT was recommended for bilateral inguinal pain which may be caused by pelvis floor dysfunction.   -- Plan for CT if pain not better after PT.      Addendum: The patient canceled her appointment, she was unable to get off of work for. I did call her with the results of her CAT scan and told her there was no obvious explanation for her bilateral groin pain. We also discussed the kidney stone in the right lower pole. The patient is eager to take care of this would like to perform shockwave lithotripsy. I told her this would not likely take care of first her pain but that we could try and shocked the stone and take care of it. She is eager to get scheduled for this. I told her that I would submit a booking slip for ASAP.

## 2013-02-04 NOTE — Discharge Instructions (Signed)
See [facility] discharge instructions in chart.  

## 2013-07-21 DIAGNOSIS — G8929 Other chronic pain: Secondary | ICD-10-CM | POA: Insufficient documentation

## 2013-07-21 DIAGNOSIS — G43909 Migraine, unspecified, not intractable, without status migrainosus: Secondary | ICD-10-CM | POA: Insufficient documentation

## 2013-07-21 NOTE — ED Notes (Signed)
Pt. reports migraine headache with emesis onset this evening .

## 2013-07-22 ENCOUNTER — Encounter (HOSPITAL_COMMUNITY): Payer: Self-pay | Admitting: Emergency Medicine

## 2013-07-22 ENCOUNTER — Emergency Department (HOSPITAL_COMMUNITY)
Admission: EM | Admit: 2013-07-22 | Discharge: 2013-07-22 | Discharge status: Against Medical Advice | Payer: BC Managed Care – PPO | Attending: Emergency Medicine | Admitting: Emergency Medicine

## 2013-12-20 IMAGING — RF DG CHOLANGIOGRAM OPERATIVE
1 series · 5 of 5 positions shown · non-contrast
Comparison: None.

CLINICAL DATA: History of cholecystectomy. Operative cholangiogram.

EXAM:
INTRAOPERATIVE CHOLANGIOGRAM
TECHNIQUE: Cholangiographic images from the C-arm fluoroscopic device were
submitted for interpretation post-operatively. Please see the
procedural report for the amount of contrast.
Fluoroscopic time:  10 seconds

[Series 1: run · 2 acquisitions, 5 frames shown]
[im 1/2]
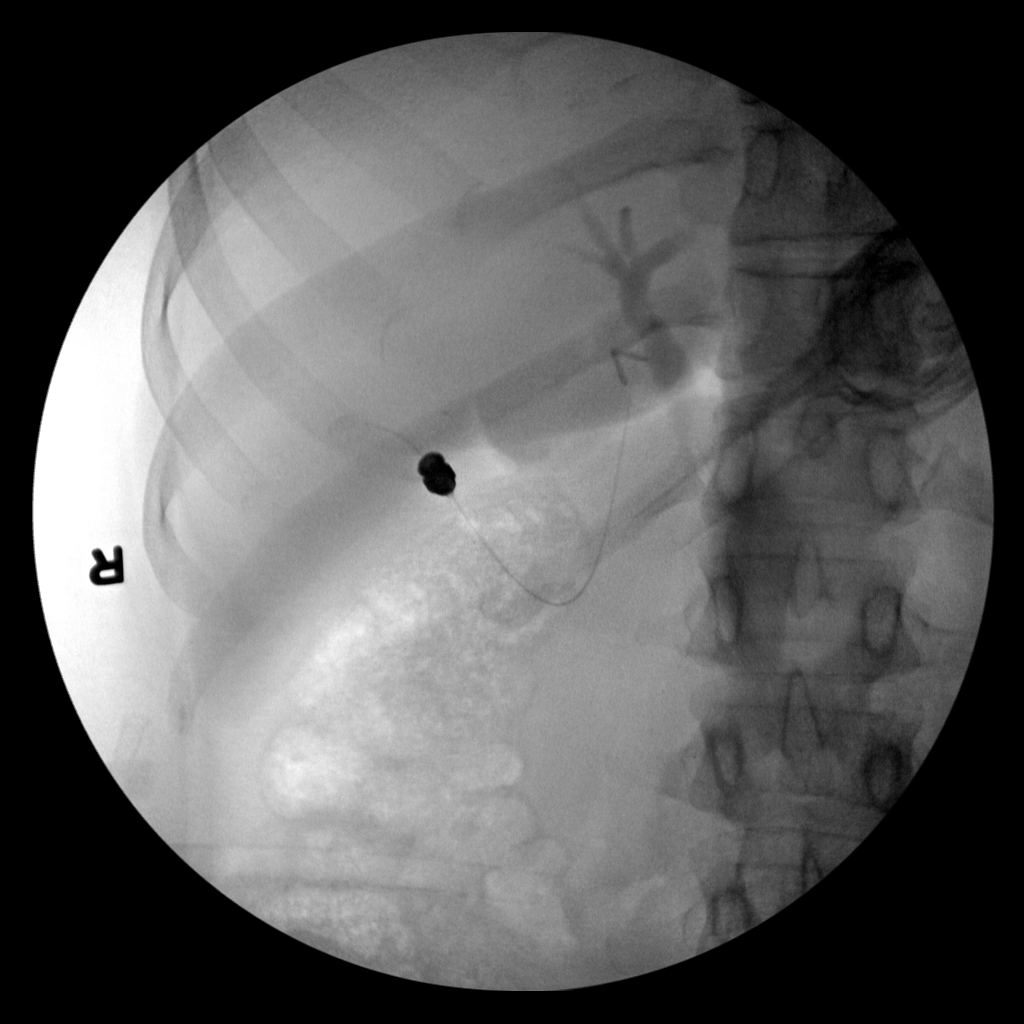
[im 1/2]
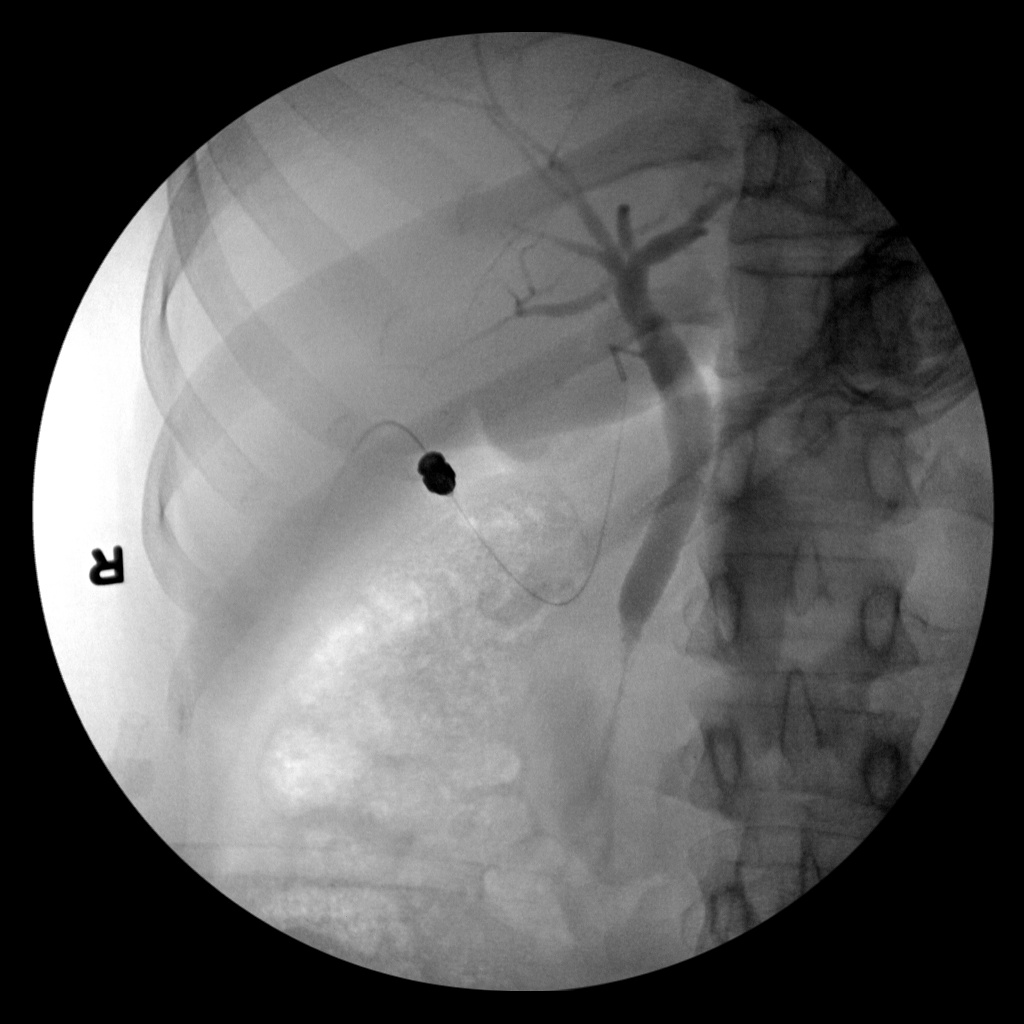
[im 1/2]
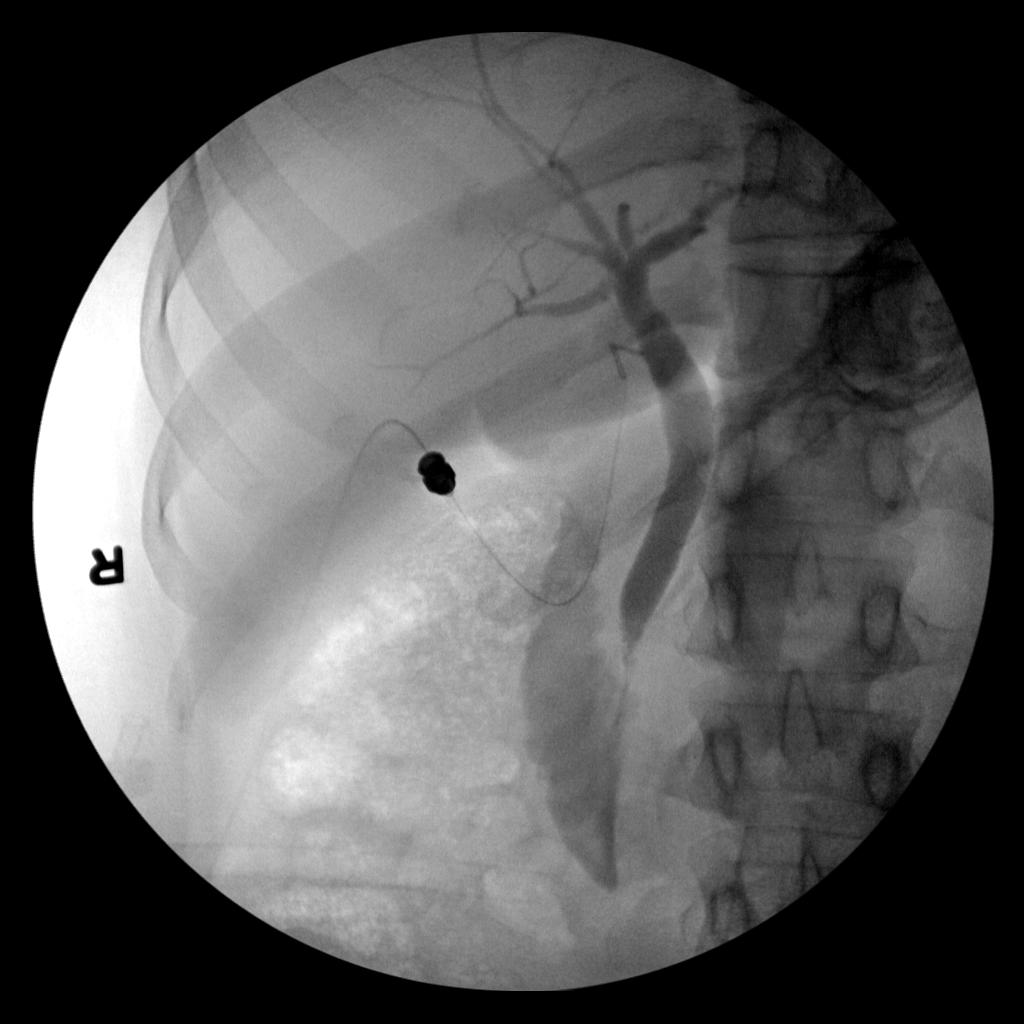
[im 1/2]
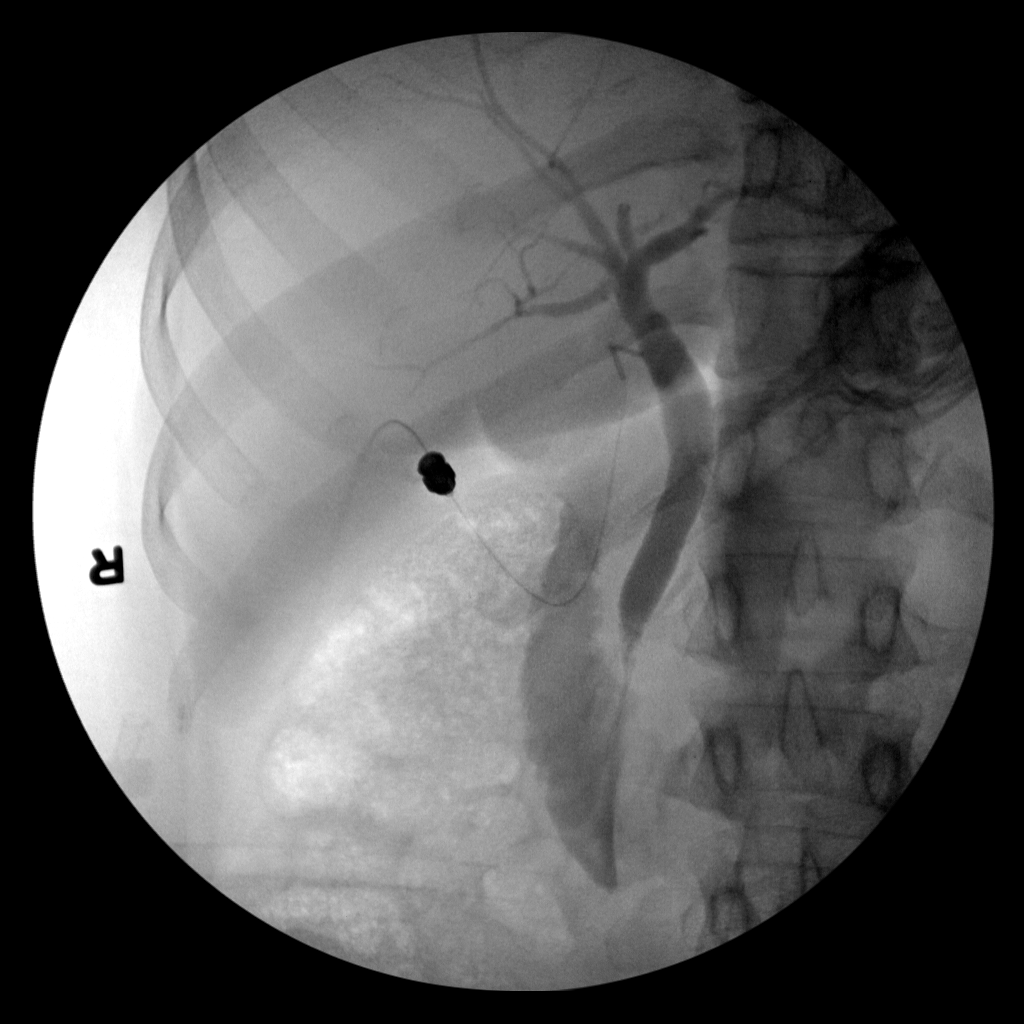
[im 2/2]
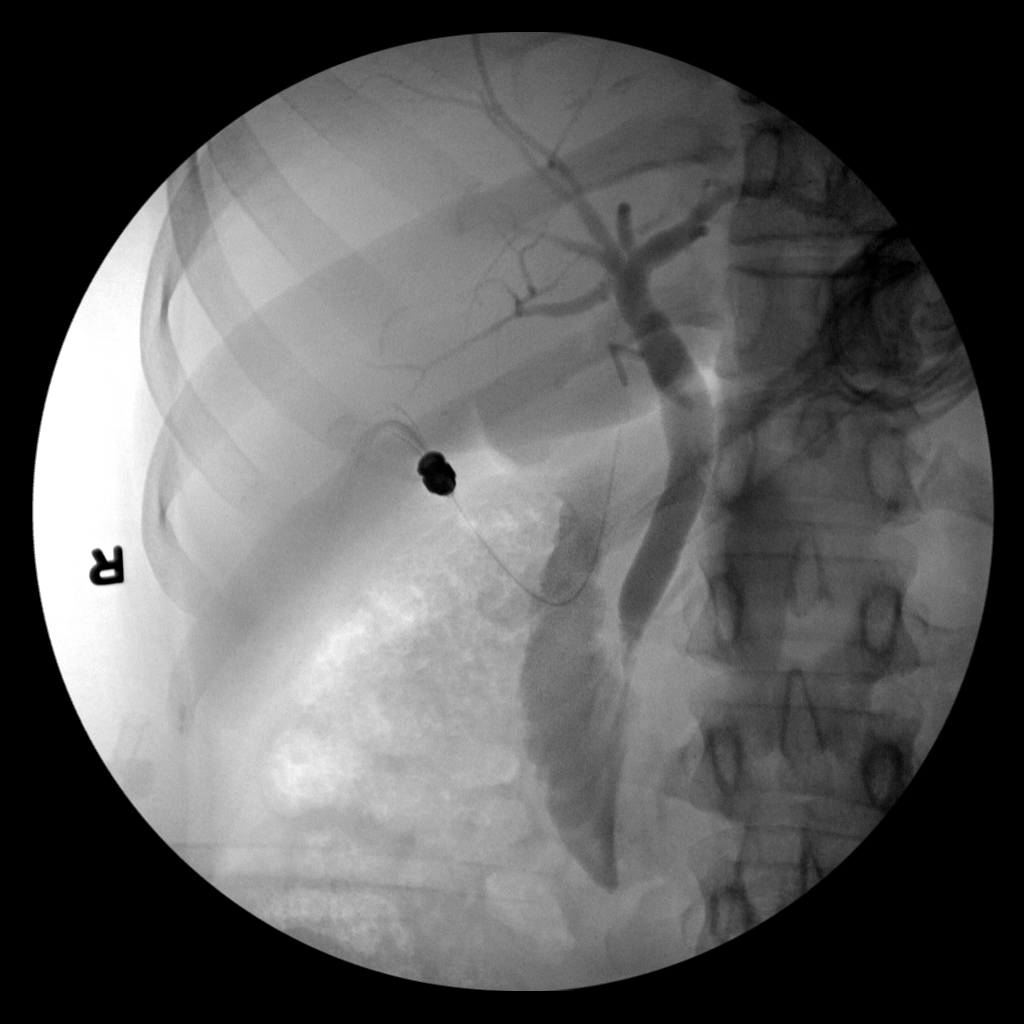

[5 of 5 positions shown; findings below may reference images not displayed]

FINDINGS: Intraoperative cholangiogram shows distention of the biliary tree.
No definite choledocholithiasis is seen. No irregularity, mass, or
obstruction is seen. There is flow of contrast into the duodenum.
IMPRESSION: No definite choledocholithiasis is seen. No irregularity, mass, or
obstruction is seen. There is flow of contrast into the duodenum.

## 2014-02-09 ENCOUNTER — Other Ambulatory Visit (HOSPITAL_COMMUNITY): Payer: Self-pay | Admitting: Specialist

## 2014-02-09 DIAGNOSIS — M542 Cervicalgia: Secondary | ICD-10-CM

## 2014-02-11 ENCOUNTER — Ambulatory Visit (HOSPITAL_COMMUNITY)
Admission: RE | Admit: 2014-02-11 | Discharge: 2014-02-11 | Disposition: A | Discharge status: Home / Self Care | Payer: 59 | Source: Ambulatory Visit | Attending: Specialist | Admitting: Specialist

## 2014-02-11 ENCOUNTER — Other Ambulatory Visit (HOSPITAL_COMMUNITY): Payer: Self-pay | Admitting: Specialist

## 2014-02-11 DIAGNOSIS — M542 Cervicalgia: Secondary | ICD-10-CM | POA: Insufficient documentation

## 2014-02-11 DIAGNOSIS — M546 Pain in thoracic spine: Secondary | ICD-10-CM | POA: Diagnosis not present

## 2014-02-11 DIAGNOSIS — N200 Calculus of kidney: Secondary | ICD-10-CM | POA: Diagnosis not present

## 2014-02-11 DIAGNOSIS — M545 Low back pain: Secondary | ICD-10-CM

## 2014-02-11 DIAGNOSIS — M549 Dorsalgia, unspecified: Secondary | ICD-10-CM | POA: Diagnosis not present

## 2014-02-26 ENCOUNTER — Emergency Department (HOSPITAL_COMMUNITY): Payer: No Typology Code available for payment source

## 2014-02-26 ENCOUNTER — Encounter (HOSPITAL_COMMUNITY): Payer: Self-pay | Admitting: *Deleted

## 2014-02-26 ENCOUNTER — Emergency Department (HOSPITAL_COMMUNITY)
Admission: EM | Admit: 2014-02-26 | Discharge: 2014-02-27 | Disposition: A | Discharge status: Home / Self Care | Payer: No Typology Code available for payment source | Attending: Emergency Medicine | Admitting: Emergency Medicine

## 2014-02-26 DIAGNOSIS — M545 Low back pain, unspecified: Secondary | ICD-10-CM

## 2014-02-26 DIAGNOSIS — K5900 Constipation, unspecified: Secondary | ICD-10-CM | POA: Insufficient documentation

## 2014-02-26 DIAGNOSIS — S3991XA Unspecified injury of abdomen, initial encounter: Secondary | ICD-10-CM | POA: Diagnosis not present

## 2014-02-26 DIAGNOSIS — G8929 Other chronic pain: Secondary | ICD-10-CM | POA: Insufficient documentation

## 2014-02-26 DIAGNOSIS — Y9389 Activity, other specified: Secondary | ICD-10-CM | POA: Insufficient documentation

## 2014-02-26 DIAGNOSIS — S0990XA Unspecified injury of head, initial encounter: Secondary | ICD-10-CM | POA: Diagnosis not present

## 2014-02-26 DIAGNOSIS — Y998 Other external cause status: Secondary | ICD-10-CM | POA: Diagnosis not present

## 2014-02-26 DIAGNOSIS — S3992XA Unspecified injury of lower back, initial encounter: Secondary | ICD-10-CM | POA: Insufficient documentation

## 2014-02-26 DIAGNOSIS — Z79899 Other long term (current) drug therapy: Secondary | ICD-10-CM | POA: Diagnosis not present

## 2014-02-26 DIAGNOSIS — Z87442 Personal history of urinary calculi: Secondary | ICD-10-CM | POA: Diagnosis not present

## 2014-02-26 DIAGNOSIS — R519 Headache, unspecified: Secondary | ICD-10-CM

## 2014-02-26 DIAGNOSIS — R1084 Generalized abdominal pain: Secondary | ICD-10-CM

## 2014-02-26 DIAGNOSIS — Y92410 Unspecified street and highway as the place of occurrence of the external cause: Secondary | ICD-10-CM | POA: Diagnosis not present

## 2014-02-26 DIAGNOSIS — Z8659 Personal history of other mental and behavioral disorders: Secondary | ICD-10-CM | POA: Diagnosis not present

## 2014-02-26 DIAGNOSIS — R51 Headache: Secondary | ICD-10-CM

## 2014-02-26 DIAGNOSIS — Z8679 Personal history of other diseases of the circulatory system: Secondary | ICD-10-CM | POA: Diagnosis not present

## 2014-02-26 MED ORDER — METOCLOPRAMIDE HCL 5 MG/ML IJ SOLN
10.0000 mg | Freq: Once | INTRAMUSCULAR | Status: AC
  Administered 2014-02-26: 10 mg via INTRAMUSCULAR
  Filled 2014-02-26: qty 2

## 2014-02-26 MED ORDER — HYDROCODONE-ACETAMINOPHEN 5-325 MG PO TABS
2.0000 | ORAL_TABLET | Freq: Once | ORAL | Status: AC
  Administered 2014-02-26: 2 via ORAL
  Filled 2014-02-26: qty 2

## 2014-02-26 MED ORDER — KETOROLAC TROMETHAMINE 60 MG/2ML IM SOLN
60.0000 mg | Freq: Once | INTRAMUSCULAR | Status: AC
  Administered 2014-02-26: 60 mg via INTRAMUSCULAR
  Filled 2014-02-26: qty 2
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
## 2014-02-27 MED ORDER — METHOCARBAMOL 500 MG PO TABS: 500.0000 mg | ORAL_TABLET | Freq: Two times a day (BID) | ORAL | Status: DC

## 2014-02-27 MED ORDER — DIAZEPAM 5 MG PO TABS
5.0000 mg | ORAL_TABLET | Freq: Once | ORAL | Status: AC
  Administered 2014-02-27: 5 mg via ORAL
  Filled 2014-02-27: qty 1

## 2014-02-27 MED ORDER — HYDROCODONE-ACETAMINOPHEN 5-325 MG PO TABS: 1.0000 | ORAL_TABLET | Freq: Four times a day (QID) | ORAL | Status: DC | PRN

## 2014-02-27 MED ORDER — MELOXICAM 7.5 MG PO TABS: 15.0000 mg | ORAL_TABLET | Freq: Every day | ORAL | Status: DC
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
## 2014-06-05 ENCOUNTER — Other Ambulatory Visit (HOSPITAL_COMMUNITY): Payer: Self-pay | Admitting: Specialist

## 2014-06-05 DIAGNOSIS — M545 Low back pain: Secondary | ICD-10-CM

## 2014-06-20 ENCOUNTER — Ambulatory Visit (HOSPITAL_COMMUNITY): Admission: RE | Admit: 2014-06-20 | Payer: 59 | Source: Ambulatory Visit

## 2014-07-02 ENCOUNTER — Other Ambulatory Visit (HOSPITAL_COMMUNITY): Payer: Self-pay | Admitting: Specialist

## 2014-07-02 DIAGNOSIS — R52 Pain, unspecified: Secondary | ICD-10-CM

## 2014-07-03 ENCOUNTER — Ambulatory Visit (HOSPITAL_COMMUNITY)
Admission: RE | Admit: 2014-07-03 | Discharge: 2014-07-03 | Disposition: A | Discharge status: Home / Self Care | Payer: No Typology Code available for payment source | Source: Ambulatory Visit | Attending: Specialist | Admitting: Specialist

## 2014-07-03 DIAGNOSIS — R52 Pain, unspecified: Secondary | ICD-10-CM

## 2014-07-03 DIAGNOSIS — M25562 Pain in left knee: Secondary | ICD-10-CM | POA: Diagnosis not present

## 2014-07-03 DIAGNOSIS — M25561 Pain in right knee: Secondary | ICD-10-CM | POA: Insufficient documentation

## 2015-03-21 ENCOUNTER — Encounter (HOSPITAL_COMMUNITY): Payer: Self-pay

## 2015-03-21 DIAGNOSIS — Z791 Long term (current) use of non-steroidal anti-inflammatories (NSAID): Secondary | ICD-10-CM

## 2015-03-21 DIAGNOSIS — Z8679 Personal history of other diseases of the circulatory system: Secondary | ICD-10-CM | POA: Insufficient documentation

## 2015-03-21 DIAGNOSIS — W01198A Fall on same level from slipping, tripping and stumbling with subsequent striking against other object, initial encounter: Secondary | ICD-10-CM

## 2015-03-21 DIAGNOSIS — K5900 Constipation, unspecified: Secondary | ICD-10-CM

## 2015-03-21 DIAGNOSIS — W19XXXD Unspecified fall, subsequent encounter: Secondary | ICD-10-CM | POA: Diagnosis not present

## 2015-03-21 DIAGNOSIS — R109 Unspecified abdominal pain: Secondary | ICD-10-CM | POA: Diagnosis not present

## 2015-03-21 DIAGNOSIS — Z8659 Personal history of other mental and behavioral disorders: Secondary | ICD-10-CM | POA: Insufficient documentation

## 2015-03-21 DIAGNOSIS — Y9289 Other specified places as the place of occurrence of the external cause: Secondary | ICD-10-CM

## 2015-03-21 DIAGNOSIS — S3992XA Unspecified injury of lower back, initial encounter: Secondary | ICD-10-CM

## 2015-03-21 DIAGNOSIS — M25569 Pain in unspecified knee: Secondary | ICD-10-CM | POA: Diagnosis not present

## 2015-03-21 DIAGNOSIS — Y998 Other external cause status: Secondary | ICD-10-CM | POA: Insufficient documentation

## 2015-03-21 DIAGNOSIS — Z79899 Other long term (current) drug therapy: Secondary | ICD-10-CM | POA: Insufficient documentation

## 2015-03-21 DIAGNOSIS — Z87442 Personal history of urinary calculi: Secondary | ICD-10-CM

## 2015-03-21 DIAGNOSIS — G8929 Other chronic pain: Secondary | ICD-10-CM | POA: Insufficient documentation

## 2015-03-21 DIAGNOSIS — M545 Low back pain: Secondary | ICD-10-CM | POA: Diagnosis present

## 2015-03-21 DIAGNOSIS — Y9301 Activity, walking, marching and hiking: Secondary | ICD-10-CM

## 2015-03-21 DIAGNOSIS — T148 Other injury of unspecified body region: Secondary | ICD-10-CM | POA: Diagnosis not present

## 2015-03-21 DIAGNOSIS — S80212A Abrasion, left knee, initial encounter: Secondary | ICD-10-CM

## 2015-03-21 DIAGNOSIS — S39012D Strain of muscle, fascia and tendon of lower back, subsequent encounter: Secondary | ICD-10-CM | POA: Diagnosis not present
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
## 2015-03-21 NOTE — ED Notes (Addendum)
UNABLE TO GET TEMP DUE TO PT DRINKING COLD DRINK

## 2015-03-22 ENCOUNTER — Emergency Department (HOSPITAL_COMMUNITY)
Admission: EM | Admit: 2015-03-22 | Discharge: 2015-03-22 | Disposition: A | Discharge status: Home / Self Care | Payer: BLUE CROSS/BLUE SHIELD | Attending: Emergency Medicine | Admitting: Emergency Medicine

## 2015-03-22 ENCOUNTER — Encounter (HOSPITAL_COMMUNITY): Payer: Self-pay

## 2015-03-22 ENCOUNTER — Emergency Department (HOSPITAL_COMMUNITY)
Admission: EM | Admit: 2015-03-22 | Discharge: 2015-03-22 | Disposition: A | Discharge status: Home / Self Care | Payer: BLUE CROSS/BLUE SHIELD | Source: Home / Self Care | Attending: Emergency Medicine | Admitting: Emergency Medicine

## 2015-03-22 ENCOUNTER — Emergency Department (HOSPITAL_COMMUNITY): Payer: BLUE CROSS/BLUE SHIELD

## 2015-03-22 DIAGNOSIS — Z8659 Personal history of other mental and behavioral disorders: Secondary | ICD-10-CM | POA: Insufficient documentation

## 2015-03-22 DIAGNOSIS — M545 Low back pain: Secondary | ICD-10-CM

## 2015-03-22 DIAGNOSIS — S39012D Strain of muscle, fascia and tendon of lower back, subsequent encounter: Secondary | ICD-10-CM | POA: Insufficient documentation

## 2015-03-22 DIAGNOSIS — T148 Other injury of unspecified body region: Secondary | ICD-10-CM | POA: Insufficient documentation

## 2015-03-22 DIAGNOSIS — R109 Unspecified abdominal pain: Secondary | ICD-10-CM | POA: Insufficient documentation

## 2015-03-22 DIAGNOSIS — Z79899 Other long term (current) drug therapy: Secondary | ICD-10-CM | POA: Insufficient documentation

## 2015-03-22 DIAGNOSIS — W19XXXD Unspecified fall, subsequent encounter: Secondary | ICD-10-CM | POA: Insufficient documentation

## 2015-03-22 DIAGNOSIS — T148XXA Other injury of unspecified body region, initial encounter: Secondary | ICD-10-CM

## 2015-03-22 DIAGNOSIS — Z8679 Personal history of other diseases of the circulatory system: Secondary | ICD-10-CM | POA: Insufficient documentation

## 2015-03-22 DIAGNOSIS — G8929 Other chronic pain: Secondary | ICD-10-CM | POA: Insufficient documentation

## 2015-03-22 DIAGNOSIS — M25569 Pain in unspecified knee: Secondary | ICD-10-CM | POA: Insufficient documentation

## 2015-03-22 DIAGNOSIS — K5900 Constipation, unspecified: Secondary | ICD-10-CM | POA: Insufficient documentation

## 2015-03-22 DIAGNOSIS — Z87442 Personal history of urinary calculi: Secondary | ICD-10-CM | POA: Insufficient documentation

## 2015-03-22 LAB — URINALYSIS, ROUTINE W REFLEX MICROSCOPIC
BILIRUBIN URINE: NEGATIVE
GLUCOSE, UA: NEGATIVE mg/dL
HGB URINE DIPSTICK: NEGATIVE
KETONES UR: NEGATIVE mg/dL
Leukocytes, UA: NEGATIVE
NITRITE: NEGATIVE
PH: 5.5 (ref 5.0–8.0)
Protein, ur: NEGATIVE mg/dL
Specific Gravity, Urine: 1.027 (ref 1.005–1.030)

## 2015-03-22 MED ORDER — HYDROCODONE-ACETAMINOPHEN 5-325 MG PO TABS
1.0000 | ORAL_TABLET | Freq: Once | ORAL | Status: AC
Start: 2015-03-22 — End: 2015-03-22
  Administered 2015-03-22: 1 via ORAL
  Filled 2015-03-22: qty 1

## 2015-03-22 MED ORDER — HYDROCODONE-ACETAMINOPHEN 5-325 MG PO TABS: 2.0000 | ORAL_TABLET | ORAL | Status: DC | PRN

## 2015-03-22 MED ORDER — KETOROLAC TROMETHAMINE 30 MG/ML IJ SOLN
30.0000 mg | Freq: Once | INTRAMUSCULAR | Status: DC
  Filled 2015-03-22: qty 1

## 2015-03-22 MED ORDER — MUPIROCIN CALCIUM 2 % EX CREA: 1.0000 "application " | TOPICAL_CREAM | Freq: Two times a day (BID) | CUTANEOUS | Status: DC

## 2015-03-22 MED ORDER — KETOROLAC TROMETHAMINE 60 MG/2ML IM SOLN
60.0000 mg | Freq: Once | INTRAMUSCULAR | Status: AC
  Administered 2015-03-22: 60 mg via INTRAMUSCULAR
  Filled 2015-03-22: qty 2

## 2015-03-22 MED ORDER — HYDROMORPHONE HCL 1 MG/ML IJ SOLN
1.0000 mg | Freq: Once | INTRAMUSCULAR | Status: AC
  Administered 2015-03-22: 1 mg via INTRAMUSCULAR
  Filled 2015-03-22: qty 1

## 2015-03-22 MED ORDER — CYCLOBENZAPRINE HCL 10 MG PO TABS: 10.0000 mg | ORAL_TABLET | Freq: Three times a day (TID) | ORAL | Status: DC | PRN

## 2015-03-22 MED ORDER — ONDANSETRON 4 MG PO TBDP
8.0000 mg | ORAL_TABLET | Freq: Once | ORAL | Status: AC
  Administered 2015-03-22: 8 mg via ORAL
  Filled 2015-03-22: qty 2

## 2015-03-22 MED ORDER — MELOXICAM 7.5 MG PO TABS: 15.0000 mg | ORAL_TABLET | Freq: Every day | ORAL | Status: DC

## 2015-03-22 NOTE — Discharge Instructions (Signed)
Abrasion °An abrasion is a cut or scrape on the outer surface of your skin. An abrasion does not extend through all of the layers of your skin. It is important to care for your abrasion properly to prevent infection. °CAUSES °Most abrasions are caused by falling on or gliding across the ground or another surface. When your skin rubs on something, the outer and inner layer of skin rubs off.  °SYMPTOMS °A cut or scrape is the main symptom of this condition. The scrape may be bleeding, or it may appear red or pink. If there was an associated fall, there may be an underlying bruise. °DIAGNOSIS °An abrasion is diagnosed with a physical exam. °TREATMENT °Treatment for this condition depends on how large and deep the abrasion is. Usually, your abrasion will be cleaned with water and mild soap. This removes any dirt or debris that may be stuck. An antibiotic ointment may be applied to the abrasion to help prevent infection. A bandage (dressing) may be placed on the abrasion to keep it clean. °You may also need a tetanus shot. °HOME CARE INSTRUCTIONS °Medicines °· Take or apply medicines only as directed by your health care provider. °· If you were prescribed an antibiotic ointment, finish all of it even if you start to feel better. °Wound Care °· Clean the wound with mild soap and water 2-3 times per day or as directed by your health care provider. Pat your wound dry with a clean towel. Do not rub it. °· There are many different ways to close and cover a wound. Follow instructions from your health care provider about: °¨ Wound care. °¨ Dressing changes and removal. °· Check your wound every day for signs of infection. Watch for: °¨ Redness, swelling, or pain. °¨ Fluid, blood, or pus. °General Instructions °· Keep the dressing dry as directed by your health care provider. Do not take baths, swim, use a hot tub, or do anything that would put your wound underwater until your health care provider approves. °· If there is  swelling, raise (elevate) the injured area above the level of your heart while you are sitting or lying down. °· Keep all follow-up visits as directed by your health care provider. This is important. °SEEK MEDICAL CARE IF: °· You received a tetanus shot and you have swelling, severe pain, redness, or bleeding at the injection site. °· Your pain is not controlled with medicine. °· You have increased redness, swelling, or pain at the site of your wound. °SEEK IMMEDIATE MEDICAL CARE IF: °· You have a red streak going away from your wound. °· You have a fever. °· You have fluid, blood, or pus coming from your wound. °· You notice a bad smell coming from your wound or your dressing. °  °This information is not intended to replace advice given to you by your health care provider. Make sure you discuss any questions you have with your health care provider. °  °Document Released: 10/20/2004 Document Revised: 10/01/2014 Document Reviewed: 01/08/2014 °Elsevier Interactive Patient Education ©2016 Else[employer] ° °Lumbosacral Strain °Lumbosacral strain is a strain of any of the parts that make up your lumbosacral vertebrae. Your lumbosacral vertebrae are the bones that make up the lower third of your backbone. Your lumbosacral vertebrae are held together by muscles and tough, fibrous tissue (ligaments).  °CAUSES  °A sudden blow to your back can cause lumbosacral strain. Also, anything that causes an excessive stretch of the muscles in the low back can cause this strain.   is typically seen when people exert themselves strenuously, fall, lift heavy objects, bend, or crouch repeatedly. RISK FACTORS  Physically demanding work.  Participation in pushing or pulling sports or sports that require a sudden twist of the back (tennis, golf, baseball).  Weight lifting.  Excessive lower back curvature.  Forward-tilted pelvis.  Weak back or abdominal muscles or both.  Tight hamstrings. SIGNS AND SYMPTOMS  Lumbosacral  strain may cause pain in the area of your injury or pain that moves (radiates) down your leg.  DIAGNOSIS Your health care provider can often diagnose lumbosacral strain through a physical exam. In some cases, you may need tests such as X-ray exams.  TREATMENT  Treatment for your lower back injury depends on many factors that your clinician will have to evaluate. However, most treatment will include the use of anti-inflammatory medicines. HOME CARE INSTRUCTIONS   Avoid hard physical activities (tennis, racquetball, waterskiing) if you are not in proper physical condition for it. This may aggravate or create problems.  If you have a back problem, avoid sports requiring sudden body movements. Swimming and walking are generally safer activities.  Maintain good posture.  Maintain a healthy weight.  For acute conditions, you may put ice on the injured area.  Put ice in a plastic bag.  Place a towel between your skin and the bag.  Leave the ice on for 20 minutes, 2-3 times a day.  When the low back starts healing, stretching and strengthening exercises may be recommended. SEEK MEDICAL CARE IF:  Your back pain is getting worse.  You experience severe back pain not relieved with medicines. SEEK IMMEDIATE MEDICAL CARE IF:   You have numbness, tingling, weakness, or problems with the use of your arms or legs.  There is a change in bowel or bladder control.  You have increasing pain in any area of the body, including your belly (abdomen).  You notice shortness of breath, dizziness, or feel faint.  You feel sick to your stomach (nauseous), are throwing up (vomiting), or become sweaty.  You notice discoloration of your toes or legs, or your feet get very cold. MAKE SURE YOU:   Understand these instructions.  Will watch your condition.  Will get help right away if you are not doing well or get worse.   This information is not intended to replace advice given to you by your health  care provider. Make sure you discuss any questions you have with your health care provider.   Document Released: 10/20/2004 Document Revised: 01/31/2014 Document Reviewed: 08/29/2012 Elsevier Interactive Patient Education [employer]

## 2015-03-22 NOTE — ED Provider Notes (Signed)
CSN: [id number]     Arrival date & time 03/21/15  2330 History   First MD Initiated Contact with Patient 03/22/15 0014     Chief Complaint  Patient presents with  . Back Pain  . Knee Pain     (Consider location/radiation/quality/duration/timing/severity/associated sxs/prior Treatment) HPI Mary Morse is a 35 y.o. female with a history of chronic back pain, comes in for evaluation of acute back and knee pain. Patient reports on Tuesday, while walking out of [employer], she tripped and fell and sustained a skinned knee and injured her back. She has tried Tylenol, Motrin, Excedrin without relief. Symptoms exacerbated with certain movements. No alleviating factors. She denies any numbness or weakness, urinary symptoms, fevers or chills, vomiting.  Past Medical History  Diagnosis Date  . Constipation   . Anxiety   . Complication of anesthesia     felt that the first surgery she had like she had a hard time waking up.  . Borderline diabetes   . Migraine     "2-3 times/month" (12/06/2012)  . Chronic back pain   . Kidney stone    Past Surgical History  Procedure Laterality Date  . Abdominal hysterectomy  05/2011  . Tubal ligation  2007  . Laparoscopic cholecystectomy  12/06/2012    w/IOC/notes 12/06/2012  . Reduction mammaplasty Bilateral 2013  . Lithotripsy  ~ 2010; 10/2012  . Cholecystectomy N/A 12/06/2012    Procedure: LAPAROSCOPIC CHOLECYSTECTOMY WITH INTRAOPERATIVE CHOLANGIOGRAM;  Surgeon: Robyne Askew, MD;  Location: [facility];  Service: General;  Laterality: N/A;   Family History  Problem Relation Age of Onset  . Cancer Father     prostate  . Cancer Paternal Grandmother     breast   Social History  Substance Use Topics  . Smoking status: Never Smoker   . Smokeless tobacco: Never Used  . Alcohol Use: No   OB History    No data available     Review of Systems A 10 point review of systems was completed and was negative except for pertinent positives and negatives as  mentioned in the history of present illness     Allergies  Morphine and related  Home Medications   Prior to Admission medications   Medication Sig Start Date End Date Taking? Authorizing Provider  HYDROcodone-acetaminophen (NORCO/VICODIN) 5-325 MG per tablet Take 1-2 tablets by mouth every 6 (six) hours as needed for moderate pain or severe pain. 02/27/14   Antony Madura, PA-C  Linaclotide (LINZESS) 145 MCG CAPS capsule Take 145 mcg by mouth every morning.    Historical Provider, MD  meloxicam (MOBIC) 7.5 MG tablet Take 2 tablets (15 mg total) by mouth daily. 02/27/14   Antony Madura, PA-C  meloxicam (MOBIC) 7.5 MG tablet Take 2 tablets (15 mg total) by mouth daily. 03/22/15   Joycie Peek, PA-C  methocarbamol (ROBAXIN) 500 MG tablet Take 1 tablet (500 mg total) by mouth 2 (two) times daily. 02/27/14   Antony Madura, PA-C  Oxycodone HCl 10 MG TABS Take 1 tablet (10 mg total) by mouth every 4 (four) hours as needed (for pain). 02/04/13   Crist Fat, MD  promethazine (PHENERGAN) 25 MG tablet Take 25 mg by mouth as needed for nausea.     Historical Provider, MD  tamsulosin (FLOMAX) 0.4 MG CAPS capsule Take 1 capsule (0.4 mg total) by mouth daily. 02/04/13   Crist Fat, MD   BP 103/58 mmHg  Pulse 79  Temp(Src) 97.5 F (36.4 C) (Oral)  Resp  14  SpO2 100% Physical Exam  Constitutional:  Awake, alert, nontoxic appearance. No apparent distress, playing on phone.  HENT:  Head: Atraumatic.  Eyes: Right eye exhibits no discharge. Left eye exhibits no discharge.  Neck: Neck supple.  Pulmonary/Chest: Effort normal. She exhibits no tenderness.  Abdominal: Soft. There is no tenderness. There is no rebound.  Musculoskeletal:  Baseline ROM, no obvious new focal weakness. Tenderness diffusely throughout paraspinal lumbar musculature, worse on the right side. No lesions, crepitus or other abnormalities noted. Superficial abrasion noted over her anterior left knee. Full active range of motion  with no bony tenderness. No effusion, erythema or warmth  Neurological:  Mental status and motor strength appears baseline for patient and situation. Gait is baseline without ataxia.  Skin: No rash noted.  Psychiatric: She has a normal mood and affect.  Nursing note and vitals reviewed.   ED Course  Procedures (including critical care time) Labs Review Labs Reviewed - No data to display  Imaging Review Dg Lumbar Spine Complete  03/22/2015  CLINICAL DATA:  Status post fall, with lower back pain. Initial encounter. EXAM: LUMBAR SPINE - COMPLETE 4+ VIEW COMPARISON:  Lumbar spine radiographs performed 02/26/2014 FINDINGS: There is no evidence of fracture or subluxation. Vertebral bodies demonstrate normal height and alignment. Intervertebral disc spaces are preserved. The visualized neural foramina are grossly unremarkable in appearance. The visualized bowel gas pattern is unremarkable in appearance; air and stool are noted within the colon. The sacroiliac joints are within normal limits. Clips are noted within the right upper quadrant, reflecting prior cholecystectomy. IMPRESSION: No evidence of fracture or subluxation along the lumbar spine. Electronically Signed   By: Roanna Raider M.D.   On: 03/22/2015 01:22   I have personally reviewed and evaluated these images and lab results as part of my medical decision-making.   EKG Interpretation None     Meds given in ED:  Medications  ketorolac (TORADOL) 30 MG/ML injection 30 mg (30 mg Intramuscular Not Given 03/22/15 0046)  HYDROcodone-acetaminophen (NORCO/VICODIN) 5-325 MG per tablet 1 tablet (1 tablet Oral Given 03/22/15 0052)    New Prescriptions   MELOXICAM (MOBIC) 7.5 MG TABLET    Take 2 tablets (15 mg total) by mouth daily.   Filed Vitals:   03/21/15 2337 03/22/15 0122  BP: 108/63 103/58  Pulse: 75 79  Temp: 97.5 F (36.4 C) 97.5 F (36.4 C)  TempSrc: Oral Oral  Resp: 16 14  SpO2: 98% 100%    MDM  Patient with low back  pain.  Refuses Toradol injection, given Norco. No neurological deficits and normal neuro exam.  Patient can walk but states is painful. Denies any urinary symptoms to suggest UTI. No loss of bowel or bladder control.  No concern for cauda equina.  No fever, night sweats, weight loss, h/o cancer, IVDU.  RICE protocol and pain medicine indicated and discussed with patient. Ambulates out of ED without difficulty.  Final diagnoses:  Right low back pain, with sciatica presence unspecified       Joycie Peek, PA-C 03/22/15 0151  Devoria Albe, MD 03/22/15 [phone]

## 2015-03-22 NOTE — ED Provider Notes (Signed)
CSN: [id number]     Arrival date & time 03/22/15  0150 History   First MD Initiated Contact with Patient 03/22/15 [phone]     Chief Complaint  Patient presents with  . Back Pain  . Knee Pain  . Flank Pain     (Consider location/radiation/quality/duration/timing/severity/associated sxs/prior Treatment) HPI Comments: Patient presents for evaluation of low back pain. Patient reports that she fell several days ago and injured her back and her left knee. She has been having pain across her lower back without radiation to the legs ever since. She was concerned that she might have a kidney infection or other kidney problem causing the pain. She was also concerned about the possibility of infection of her left knee. Patient reports that she has been taking over-the-counter medicine but the pain has become severe.  Patient is a 35 y.o. female presenting with back pain, knee pain, and flank pain.  Back Pain Knee Pain Associated symptoms: back pain   Flank Pain    Past Medical History  Diagnosis Date  . Constipation   . Anxiety   . Complication of anesthesia     felt that the first surgery she had like she had a hard time waking up.  . Borderline diabetes   . Migraine     "2-3 times/month" (12/06/2012)  . Chronic back pain   . Kidney stone    Past Surgical History  Procedure Laterality Date  . Abdominal hysterectomy  05/2011  . Tubal ligation  2007  . Laparoscopic cholecystectomy  12/06/2012    w/IOC/notes 12/06/2012  . Reduction mammaplasty Bilateral 2013  . Lithotripsy  ~ 2010; 10/2012  . Cholecystectomy N/A 12/06/2012    Procedure: LAPAROSCOPIC CHOLECYSTECTOMY WITH INTRAOPERATIVE CHOLANGIOGRAM;  Surgeon: Robyne Askew, MD;  Location: [facility] OR;  Service: General;  Laterality: N/A;   Family History  Problem Relation Age of Onset  . Cancer Father     prostate  . Cancer Paternal Grandmother     breast   Social History  Substance Use Topics  . Smoking status: Never Smoker   .  Smokeless tobacco: Never Used  . Alcohol Use: No   OB History    No data available     Review of Systems  Genitourinary: Positive for flank pain.  Musculoskeletal: Positive for back pain.  Skin: Positive for wound.  All other systems reviewed and are negative.     Allergies  Morphine and related  Home Medications   Prior to Admission medications   Medication Sig Start Date End Date Taking? Authorizing Provider  HYDROcodone-acetaminophen (NORCO/VICODIN) 5-325 MG per tablet Take 1-2 tablets by mouth every 6 (six) hours as needed for moderate pain or severe pain. 02/27/14   Antony Madura, PA-C  Linaclotide (LINZESS) 145 MCG CAPS capsule Take 145 mcg by mouth every morning.    Historical Provider, MD  meloxicam (MOBIC) 7.5 MG tablet Take 2 tablets (15 mg total) by mouth daily. 03/22/15   Joycie Peek, PA-C  methocarbamol (ROBAXIN) 500 MG tablet Take 1 tablet (500 mg total) by mouth 2 (two) times daily. 02/27/14   Antony Madura, PA-C  Oxycodone HCl 10 MG TABS Take 1 tablet (10 mg total) by mouth every 4 (four) hours as needed (for pain). 02/04/13   Crist Fat, MD  promethazine (PHENERGAN) 25 MG tablet Take 25 mg by mouth as needed for nausea.     Historical Provider, MD  tamsulosin (FLOMAX) 0.4 MG CAPS capsule Take 1 capsule (0.4 mg total) by  mouth daily. 02/04/13   Crist Fat, MD   BP 98/74 mmHg  Pulse 67  Temp(Src) 97.4 F (36.3 C) (Oral)  Resp 16  SpO2 94% Physical Exam  Constitutional: She is oriented to person, place, and time. She appears well-developed and well-nourished. No distress.  HENT:  Head: Normocephalic and atraumatic.  Right Ear: Hearing normal.  Left Ear: Hearing normal.  Nose: Nose normal.  Mouth/Throat: Oropharynx is clear and moist and mucous membranes are normal.  Eyes: Conjunctivae and EOM are normal. Pupils are equal, round, and reactive to light.  Neck: Normal range of motion. Neck supple.  Cardiovascular: Regular rhythm, S1 normal and S2  normal.  Exam reveals no gallop and no friction rub.   No murmur heard. Pulmonary/Chest: Effort normal and breath sounds normal. No respiratory distress. She exhibits no tenderness.  Abdominal: Soft. Normal appearance and bowel sounds are normal. There is no hepatosplenomegaly. There is no tenderness. There is no rebound, no guarding, no tenderness at McBurney's point and negative Murphy's sign. No hernia.  Musculoskeletal: Normal range of motion.       Lumbar back: She exhibits tenderness.       Back:  Neurological: She is alert and oriented to person, place, and time. She has normal strength. No cranial nerve deficit or sensory deficit. Coordination normal. GCS eye subscore is 4. GCS verbal subscore is 5. GCS motor subscore is 6.  Skin: Skin is warm, dry and intact. No rash noted. No cyanosis.     Psychiatric: She has a normal mood and affect. Her speech is normal and behavior is normal. Thought content normal.  Nursing note and vitals reviewed.   ED Course  Procedures (including critical care time) Labs Review Labs Reviewed  URINALYSIS, ROUTINE W REFLEX MICROSCOPIC (NOT AT [facility]) - Abnormal; Notable for the following:    APPearance CLOUDY (*)    All other components within normal limits    Imaging Review Dg Lumbar Spine Complete  03/22/2015  CLINICAL DATA:  Status post fall, with lower back pain. Initial encounter. EXAM: LUMBAR SPINE - COMPLETE 4+ VIEW COMPARISON:  Lumbar spine radiographs performed 02/26/2014 FINDINGS: There is no evidence of fracture or subluxation. Vertebral bodies demonstrate normal height and alignment. Intervertebral disc spaces are preserved. The visualized neural foramina are grossly unremarkable in appearance. The visualized bowel gas pattern is unremarkable in appearance; air and stool are noted within the colon. The sacroiliac joints are within normal limits. Clips are noted within the right upper quadrant, reflecting prior cholecystectomy. IMPRESSION: No  evidence of fracture or subluxation along the lumbar spine. Electronically Signed   By: Roanna Raider M.D.   On: 03/22/2015 01:22   I have personally reviewed and evaluated these images and lab results as part of my medical decision-making.   EKG Interpretation None      MDM   Final diagnoses:  None   lumbar strain Abrasion  Patient has diffuse paraspinal soft tissue tenderness of the lumbar region without any specific midline tenderness. She has normal strength and sensation in her lower extremities. Pain does not radiate to the legs. No concern for fracture and neurologic evaluation is entirely normal. Patient reassured that this is musculoskeletal in nature and will improve, will require treatment. Patient also concerned about her left knee. She has normal range of motion without any concern for bony injury. She does have a large abrasion, but there is no sign of infection. Will be prescribed mupirocin to be used twice daily.  Gilda Crease, MD 03/22/15 [phone]

## 2015-03-22 NOTE — ED Notes (Signed)
Pt has been having back pain and left knee pain. Was seen in fast track and they didn't do anything for her. Dx with an 8mm kidney stone a couple years ago and had lithotripsy but it didn't do anything to the stone.

## 2015-03-22 NOTE — ED Notes (Signed)
MD at bedside. 

## 2015-03-22 NOTE — Discharge Instructions (Signed)
There does not appear to be an emergent cause for your discomfort at this time. Please take your medicine as prescribed. Follow-up with your doctor or the [facility] in order to establish primary care. Return to ED for any new or worsening symptoms.  Back Pain, Adult Back pain is very common in adults.The cause of back pain is rarely dangerous and the pain often gets better over time.The cause of your back pain may not be known. Some common causes of back pain include:  Strain of the muscles or ligaments supporting the spine.  Wear and tear (degeneration) of the spinal disks.  Arthritis.  Direct injury to the back. For many people, back pain may return. Since back pain is rarely dangerous, most people can learn to manage this condition on their own. HOME CARE INSTRUCTIONS Watch your back pain for any changes. The following actions may help to lessen any discomfort you are feeling:  Remain active. It is stressful on your back to sit or stand in one place for long periods of time. Do not sit, drive, or stand in one place for more than 30 minutes at a time. Take short walks on even surfaces as soon as you are able.Try to increase the length of time you walk each day.  Exercise regularly as directed by your health care provider. Exercise helps your back heal faster. It also helps avoid future injury by keeping your muscles strong and flexible.  Do not stay in bed.Resting more than 1-2 days can delay your recovery.  Pay attention to your body when you bend and lift. The most comfortable positions are those that put less stress on your recovering back. Always use proper lifting techniques, including:  Bending your knees.  Keeping the load close to your body.  Avoiding twisting.  Find a comfortable position to sleep. Use a firm mattress and lie on your side with your knees slightly bent. If you lie on your back, put a pillow under your knees.  Avoid feeling anxious  or stressed.Stress increases muscle tension and can worsen back pain.It is important to recognize when you are anxious or stressed and learn ways to manage it, such as with exercise.  Take medicines only as directed by your health care provider. Over-the-counter medicines to reduce pain and inflammation are often the most helpful.Your health care provider may prescribe muscle relaxant drugs.These medicines help dull your pain so you can more quickly return to your normal activities and healthy exercise.  Apply ice to the injured area:  Put ice in a plastic bag.  Place a towel between your skin and the bag.  Leave the ice on for 20 minutes, 2-3 times a day for the first 2-3 days. After that, ice and heat may be alternated to reduce pain and spasms.  Maintain a healthy weight. Excess weight puts extra stress on your back and makes it difficult to maintain good posture. SEEK MEDICAL CARE IF:  You have pain that is not relieved with rest or medicine.  You have increasing pain going down into the legs or buttocks.  You have pain that does not improve in one week.  You have night pain.  You lose weight.  You have a fever or chills. SEEK IMMEDIATE MEDICAL CARE IF:   You develop new bowel or bladder control problems.  You have unusual weakness or numbness in your arms or legs.  You develop nausea or vomiting.  You develop abdominal pain.  You feel faint.  This information is not intended to replace advice given to you by your health care provider. Make sure you discuss any questions you have with your health care provider.   Document Released: 01/10/2005 Document Revised: 01/31/2014 Document Reviewed: 05/14/2013 Elsevier Interactive Patient Education [employer].

## 2015-03-22 NOTE — ED Notes (Signed)
Pt states that she wants further testing done, unhappy that she did not get a pain shot, wants urine testing and blood work done. Pt states that she is going to check back in after discharge.

## 2015-04-05 ENCOUNTER — Emergency Department (HOSPITAL_COMMUNITY)
Admission: EM | Admit: 2015-04-05 | Discharge: 2015-04-06 | Disposition: A | Discharge status: Home / Self Care | Payer: BLUE CROSS/BLUE SHIELD | Attending: Emergency Medicine | Admitting: Emergency Medicine

## 2015-04-05 ENCOUNTER — Emergency Department (HOSPITAL_COMMUNITY): Payer: BLUE CROSS/BLUE SHIELD

## 2015-04-05 DIAGNOSIS — Z8719 Personal history of other diseases of the digestive system: Secondary | ICD-10-CM | POA: Insufficient documentation

## 2015-04-05 DIAGNOSIS — G43909 Migraine, unspecified, not intractable, without status migrainosus: Secondary | ICD-10-CM | POA: Diagnosis not present

## 2015-04-05 DIAGNOSIS — G8929 Other chronic pain: Secondary | ICD-10-CM | POA: Insufficient documentation

## 2015-04-05 DIAGNOSIS — N200 Calculus of kidney: Secondary | ICD-10-CM | POA: Insufficient documentation

## 2015-04-05 DIAGNOSIS — Z8659 Personal history of other mental and behavioral disorders: Secondary | ICD-10-CM | POA: Diagnosis not present

## 2015-04-05 DIAGNOSIS — Z79899 Other long term (current) drug therapy: Secondary | ICD-10-CM | POA: Insufficient documentation

## 2015-04-05 DIAGNOSIS — R109 Unspecified abdominal pain: Secondary | ICD-10-CM | POA: Diagnosis present

## 2015-04-05 LAB — URINALYSIS, ROUTINE W REFLEX MICROSCOPIC
Bilirubin Urine: NEGATIVE
GLUCOSE, UA: NEGATIVE mg/dL
Hgb urine dipstick: NEGATIVE
Ketones, ur: NEGATIVE mg/dL
LEUKOCYTES UA: NEGATIVE
Nitrite: NEGATIVE
PROTEIN: NEGATIVE mg/dL
Specific Gravity, Urine: 1.02 (ref 1.005–1.030)
pH: 6.5 (ref 5.0–8.0)

## 2015-04-05 MED ORDER — SODIUM CHLORIDE 0.9 % IV BOLUS (SEPSIS)
1000.0000 mL | Freq: Once | INTRAVENOUS | Status: AC
  Administered 2015-04-05: 1000 mL via INTRAVENOUS

## 2015-04-05 MED ORDER — ONDANSETRON HCL 4 MG/2ML IJ SOLN
4.0000 mg | Freq: Once | INTRAMUSCULAR | Status: AC
  Administered 2015-04-05: 4 mg via INTRAVENOUS
  Filled 2015-04-05: qty 2

## 2015-04-05 MED ORDER — FENTANYL CITRATE (PF) 100 MCG/2ML IJ SOLN
100.0000 ug | Freq: Once | INTRAMUSCULAR | Status: AC
  Administered 2015-04-05: 100 ug via INTRAVENOUS
  Filled 2015-04-05: qty 2

## 2015-04-05 NOTE — ED Provider Notes (Signed)
CSN: [id number]     Arrival date & time 04/05/15  1951 History   First MD Initiated Contact with Patient 04/05/15 2139     Chief Complaint  Patient presents with  . Flank Pain    right   Pt is a 35 yo Caucasian female presenting with right flank pain for the past month. She states the pain is an 8/10, constant, stabbing and feels like a ton of bricks on her kidney. She states that rotating her back in the morning increases the pain. Pt states the pain radiates around to her left ovary which started a week after the back pain. Admits to frequency, urgency and nausea. Denies vomiting, chest pain, shortness of breath, dysuria, blood in her urine, fevers or changes in bowels.  Pt had prior cholecystectomy and hysterectomy. She was seen on 03/22/15 at [facility] for similar pain and diagnosed with a lumbar strain. Pt has tried Tylenol, Ibuprofen, Excedrin and Oxycodone with no relief.  Patient is a 35 y.o. female presenting with flank pain. The history is provided by the patient and medical records. No language interpreter was used.  Flank Pain Associated symptoms include nausea. Pertinent negatives include no abdominal pain, chest pain, chills, congestion, coughing, fever, headaches, neck pain, rash, sore throat or vomiting.    Past Medical History  Diagnosis Date  . Constipation   . Anxiety   . Complication of anesthesia     felt that the first surgery she had like she had a hard time waking up.  . Borderline diabetes   . Migraine     "2-3 times/month" (12/06/2012)  . Chronic back pain   . Kidney stone    Past Surgical History  Procedure Laterality Date  . Abdominal hysterectomy  05/2011  . Tubal ligation  2007  . Laparoscopic cholecystectomy  12/06/2012    w/IOC/notes 12/06/2012  . Reduction mammaplasty Bilateral 2013  . Lithotripsy  ~ 2010; 10/2012  . Cholecystectomy N/A 12/06/2012    Procedure: LAPAROSCOPIC CHOLECYSTECTOMY WITH INTRAOPERATIVE CHOLANGIOGRAM;  Surgeon: Robyne Askew,  MD;  Location: [facility] OR;  Service: General;  Laterality: N/A;   Family History  Problem Relation Age of Onset  . Cancer Father     prostate  . Cancer Paternal Grandmother     breast   Social History  Substance Use Topics  . Smoking status: Never Smoker   . Smokeless tobacco: Never Used  . Alcohol Use: No   OB History    No data available     Review of Systems  Constitutional: Negative for fever and chills.  HENT: Negative for congestion and sore throat.   Eyes: Negative for visual disturbance.  Respiratory: Negative for cough, shortness of breath and wheezing.   Cardiovascular: Negative for chest pain and palpitations.  Gastrointestinal: Positive for nausea. Negative for vomiting, abdominal pain and diarrhea.  Genitourinary: Positive for frequency and flank pain. Negative for dysuria, hematuria, vaginal bleeding and vaginal discharge.  Musculoskeletal: Negative for back pain and neck pain.  Skin: Negative for rash.  Neurological: Negative for light-headedness and headaches.      Allergies  Morphine and related  Home Medications   Prior to Admission medications   Medication Sig Start Date End Date Taking? Authorizing Provider  Ascorbic Acid (VITAMIN C PO) Take 1 tablet by mouth daily.   Yes Historical Provider, MD  Biotin (BIOTIN 5000) 5 MG CAPS Take 5 mg by mouth daily.   Yes Historical Provider, MD  cyclobenzaprine (FLEXERIL) 10 MG tablet  Take 1 tablet (10 mg total) by mouth 3 (three) times daily as needed for muscle spasms. 03/22/15  Yes Gilda Creasehristopher J Pollina, MD  Multiple Vitamin (MULTIVITAMIN WITH MINERALS) TABS tablet Take 1 tablet by mouth daily.   Yes Historical Provider, MD  Oxycodone HCl 10 MG TABS Take 10 mg by mouth every 4 (four) hours as needed (for pain).   Yes Historical Provider, MD  promethazine (PHENERGAN) 25 MG tablet Take 25 mg by mouth every 6 (six) hours as needed for nausea.    Yes Historical Provider, MD  HYDROcodone-acetaminophen (NORCO/VICODIN)  5-325 MG tablet Take 2 tablets by mouth every 4 (four) hours as needed for moderate pain. Patient not taking: Reported on 04/05/2015 03/22/15   Gilda Creasehristopher J Pollina, MD  meloxicam (MOBIC) 7.5 MG tablet Take 2 tablets (15 mg total) by mouth daily. Patient not taking: Reported on 04/05/2015 03/22/15   Joycie PeekBenjamin Cartner, PA-C  mupirocin cream (BACTROBAN) 2 % Apply 1 application topically 2 (two) times daily. Patient not taking: Reported on 04/05/2015 03/22/15   Gilda Creasehristopher J Pollina, MD  naproxen (NAPROSYN) 250 MG tablet Take 1 tablet (250 mg total) by mouth 2 (two) times daily with a meal. 04/06/15   Everlene FarrierWilliam Marijah Larranaga, PA-C   BP 120/80 mmHg  Pulse 76  Temp(Src) 98.6 F (37 C) (Oral)  Resp 20  Ht 5\' 1"  (1.549 m)  Wt 83.915 kg  BMI 34.97 kg/m2  SpO2 100% Physical Exam  Constitutional: She appears well-developed and well-nourished. No distress.  Nontoxic appearing.  HENT:  Head: Normocephalic and atraumatic.  Mouth/Throat: Oropharynx is clear and moist.  Eyes: Conjunctivae are normal. Pupils are equal, round, and reactive to light. Right eye exhibits no discharge. Left eye exhibits no discharge.  Neck: Neck supple.  Cardiovascular: Normal rate, regular rhythm, normal heart sounds and intact distal pulses.  Exam reveals no gallop and no friction rub.   No murmur heard. Pulmonary/Chest: Effort normal and breath sounds normal. No respiratory distress. She has no wheezes. She has no rales.  Abdominal: Soft. Bowel sounds are normal. She exhibits no distension and no mass. There is tenderness. There is no rebound and no guarding.  Abdomen is soft and nontender to palpation. Bowel sounds are present. Patient has mild tenderness to her right flank. No peritoneal signs. No psoas or obturator sign.  Musculoskeletal: Normal range of motion. She exhibits no edema or tenderness.  No midline neck or back tenderness.  Lymphadenopathy:    She has no cervical adenopathy.  Neurological: She is alert. Coordination  normal.  Skin: Skin is warm and dry. No rash noted. She is not diaphoretic. No erythema. No pallor.  Psychiatric: She has a normal mood and affect. Her behavior is normal.  Nursing note and vitals reviewed.   ED Course  Procedures (including critical care time) Labs Review Labs Reviewed  BASIC METABOLIC PANEL - Abnormal; Notable for the following:    Glucose, Bld 103 (*)    All other components within normal limits  CBC WITH DIFFERENTIAL/PLATELET - Abnormal; Notable for the following:    Hemoglobin 11.2 (*)    HCT 35.3 (*)    MCH 24.8 (*)    All other components within normal limits  URINALYSIS, ROUTINE W REFLEX MICROSCOPIC (NOT [facility])    Imaging Review [place] Renal  04/06/2015  CLINICAL DATA:  RIGHT flank pain.  History of kidney stones. EXAM: RENAL / URINARY TRACT ULTRASOUND COMPLETE COMPARISON:  Lumbar spine radiograph March 22, 2015 FINDINGS: Right Kidney: Length: 9.2 cm. 7  mm RIGHT upper pole echogenic probable renal calculus, upper pole caliectasis. No frank hydronephrosis. Left Kidney: Length: 10.4 cm. Echogenicity within normal limits. No mass or hydronephrosis visualized. Bladder: Appears normal for degree of bladder distention. IMPRESSION: 7 mm suspected RIGHT upper pole renal calculus with upper pole caliectasis. Electronically Signed   By: Awilda Metro M.D.   On: 04/06/2015 00:24   I have personally reviewed and evaluated these images and lab results as part of my medical decision-making.   EKG Interpretation None      Filed Vitals:   04/05/15 2016 04/06/15 0002  BP: 116/80 120/80  Pulse: 89 76  Temp: 98.6 F (37 C) 98.6 F (37 C)  TempSrc: Oral Oral  Resp: 18 20  Height:  (1.549 m)   Weight: 83.915 kg   SpO2: 100% 100%     MDM   Meds given in ED:  Medications  ketorolac (TORADOL) 30 MG/ML injection 30 mg (not administered)  sodium chloride 0.9 % bolus 1,000 mL (0 mLs Intravenous Stopped 04/06/15 0020)  ondansetron (ZOFRAN) injection 4 mg (4  mg Intravenous Given 04/05/15 2331)  fentaNYL (SUBLIMAZE) injection 100 mcg (100 mcg Intravenous Given 04/05/15 2335)  fentaNYL (SUBLIMAZE) injection 100 mcg (100 mcg Intravenous Given 04/06/15 0021)    New Prescriptions   NAPROXEN (NAPROSYN) 250 MG TABLET    Take 1 tablet (250 mg total) by mouth 2 (two) times daily with a meal.    Final diagnoses:  Renal calculus, right  Right flank pain   This is a 35 yo Caucasian female presenting with right flank pain for the past month. She states the pain is an 8/10, constant, stabbing and feels like a ton of bricks on her kidney. She states that rotating her back in the morning increases the pain. Pt states the pain radiates around to her left ovary which started a week after the back pain.  On exam the patient is afebrile and nontoxic-appearing. Her abdomen is soft and nontender palpation. She has moderate right flank tenderness to palpation. No midline neck or back tenderness. Urinalysis is within normal limits. No signs of infection. CBC is unremarkable. BMP shows preserved kidney function with a creatinine is 0.93. BMP is unremarkable. Renal ultrasound indicates a 7 mm suspected right upper pole renal calculus with upper pole caliectasis. No hydronephrosis. This is a possible cause of her pain. Her pain might also be musculoskeletal, as she has worsening pain with movement.  At reevaluation patient reports feeling better after pain medicine. She is tolerating by mouth. We'll discharge with prescription for naproxen and have her follow-up with urology and primary care. I discussed return precautions. I advised the patient to follow-up with their primary care provider this week. I advised the patient to return to the emergency department with new or worsening symptoms or new concerns. The patient verbalized understanding and agreement with plan.     This patient was discussed with Dr. Donnald Garre who agrees with assessment and plan.   Everlene Farrier,  PA-C 04/06/15 7829  Arby Barrette, MD 04/06/15 [phone]

## 2015-04-05 NOTE — ED Notes (Signed)
Patient c/o right kidney pain that radiates towards her ovary. [facility]. States this has not resolved. Patient previously seen at [facility]. Patient c/o nausea and chills.

## 2015-04-06 LAB — BASIC METABOLIC PANEL
Anion gap: 8 (ref 5–15)
BUN: 17 mg/dL (ref 6–20)
CHLORIDE: 105 mmol/L (ref 101–111)
CO2: 28 mmol/L (ref 22–32)
CREATININE: 0.93 mg/dL (ref 0.44–1.00)
Calcium: 9 mg/dL (ref 8.9–10.3)
GFR calc Af Amer: >60 mL/min (ref >60)
GFR calc non Af Amer: >60 mL/min (ref >60)
GLUCOSE: 103 mg/dL — AB (ref 65–99)
Potassium: 4 mmol/L (ref 3.5–5.1)
SODIUM: 141 mmol/L (ref 135–145)

## 2015-04-06 LAB — CBC WITH DIFFERENTIAL/PLATELET
Basophils Absolute: 0 10*3/uL (ref 0.0–0.1)
Basophils Relative: 0 %
EOS ABS: 0.1 10*3/uL (ref 0.0–0.7)
EOS PCT: 1 %
HCT: 35.3 % — ABNORMAL LOW (ref 36.0–46.0)
Hemoglobin: 11.2 g/dL — ABNORMAL LOW (ref 12.0–15.0)
LYMPHS ABS: 2.2 10*3/uL (ref 0.7–4.0)
Lymphocytes Relative: 25 %
MCH: 24.8 pg — AB (ref 26.0–34.0)
MCHC: 31.7 g/dL (ref 30.0–36.0)
MCV: 78.3 fL (ref 78.0–100.0)
MONO ABS: 0.7 10*3/uL (ref 0.1–1.0)
MONOS PCT: 7 %
Neutro Abs: 5.8 10*3/uL (ref 1.7–7.7)
Neutrophils Relative %: 67 %
PLATELETS: 157 10*3/uL (ref 150–400)
RBC: 4.51 MIL/uL (ref 3.87–5.11)
RDW: 15.5 % (ref 11.5–15.5)
WBC: 8.8 10*3/uL (ref 4.0–10.5)

## 2015-04-06 MED ORDER — KETOROLAC TROMETHAMINE 30 MG/ML IJ SOLN
30.0000 mg | Freq: Once | INTRAMUSCULAR | Status: AC
  Administered 2015-04-06: 30 mg via INTRAVENOUS
  Filled 2015-04-06: qty 1

## 2015-04-06 MED ORDER — NAPROXEN 250 MG PO TABS: 250.0000 mg | ORAL_TABLET | Freq: Two times a day (BID) | ORAL | Status: DC

## 2015-04-06 MED ORDER — FENTANYL CITRATE (PF) 100 MCG/2ML IJ SOLN
100.0000 ug | Freq: Once | INTRAMUSCULAR | Status: AC
  Administered 2015-04-06: 100 ug via INTRAVENOUS
  Filled 2015-04-06: qty 2

## 2015-04-06 NOTE — Discharge Instructions (Signed)
Flank Pain °Flank pain refers to pain that is located on the side of the body between the upper abdomen and the back. The pain may occur over a short period of time (acute) or may be long-term or reoccurring (chronic). It may be mild or severe. Flank pain can be caused by many things. °CAUSES  °Some of the more common causes of flank pain include: °· Muscle strains.   °· Muscle spasms.   °· A disease of your spine (vertebral disk disease).   °· A lung infection (pneumonia).   °· Fluid around your lungs (pulmonary edema).   °· A kidney infection.   °· Kidney stones.   °· A very painful skin rash caused by the chickenpox virus (shingles).   °· Gallbladder disease.   °HOME CARE INSTRUCTIONS  °Home care will depend on the cause of your pain. In general, °· Rest as directed by your caregiver. °· Drink enough fluids to keep your urine clear or pale yellow. °· Only take over-the-counter or prescription medicines as directed by your caregiver. Some medicines may help relieve the pain. °· Tell your caregiver about any changes in your pain. °· Follow up with your caregiver as directed. °SEEK IMMEDIATE MEDICAL CARE IF:  °· Your pain is not controlled with medicine.   °· You have new or worsening symptoms. °· Your pain increases.   °· You have abdominal pain.   °· You have shortness of breath.   °· You have persistent nausea or vomiting.   °· You have swelling in your abdomen.   °· You feel faint or pass out.   °· You have blood in your urine. °· You have a fever or persistent symptoms for more than 2-3 days. °· You have a fever and your symptoms suddenly get worse. °MAKE SURE YOU:  °· Understand these instructions. °· Will watch your condition. °· Will get help right away if you are not doing well or get worse. °  °This information is not intended to replace advice given to you by your health care provider. Make sure you discuss any questions you have with your health care provider. °  °Document Released: 03/03/2005 Document  Revised: 10/05/2011 Document Reviewed: 08/25/2011 °Elsevier Interactive Patient Education ©2016 [employer] ° °Kidney Stones °Kidney stones (urolithiasis) are deposits that form inside your kidneys. The intense pain is caused by the stone moving through the urinary tract. When the stone moves, the ureter goes into spasm around the stone. The stone is usually passed in the urine.  °CAUSES  °· A disorder that makes certain neck glands produce too much parathyroid hormone (primary hyperparathyroidism). °· A buildup of uric acid crystals, similar to gout in your joints. °· Narrowing (stricture) of the ureter. °· A kidney obstruction present at birth (congenital obstruction). °· Previous surgery on the kidney or ureters. °· Numerous kidney infections. °SYMPTOMS  °· Feeling sick to your stomach (nauseous). °· Throwing up (vomiting). °· Blood in the urine (hematuria). °· Pain that usually spreads (radiates) to the groin. °· Frequency or urgency of urination. °DIAGNOSIS  °· Taking a history and physical exam. °· Blood or urine tests. °· CT scan. °· Occasionally, an examination of the inside of the urinary bladder (cystoscopy) is performed. °TREATMENT  °· Observation. °· Increasing your fluid intake. °· Extracorporeal shock wave lithotripsy--This is a noninvasive procedure that uses shock waves to break up kidney stones. °· Surgery may be needed if you have severe pain or persistent obstruction. There are various surgical procedures. Most of the procedures are performed with the use of small instruments. Only small incisions are   needed to accommodate these instruments, so recovery time is minimized. °The size, location, and chemical composition are all important variables that will determine the proper choice of action for you. Talk to your health care provider to better understand your situation so that you will minimize the risk of injury to yourself and your kidney.  °HOME CARE INSTRUCTIONS  °· Drink enough water and  fluids to keep your urine clear or pale yellow. This will help you to pass the stone or stone fragments. °· Strain all urine through the provided strainer. Keep all particulate matter and stones for your health care provider to see. The stone causing the pain may be as small as a grain of salt. It is very important to use the strainer each and every time you pass your urine. The collection of your stone will allow your health care provider to analyze it and verify that a stone has actually passed. The stone analysis will often identify what you can do to reduce the incidence of recurrences. °· Only take over-the-counter or prescription medicines for pain, discomfort, or fever as directed by your health care provider. °· Keep all follow-up visits as told by your health care provider. This is important. °· Get follow-up X-rays if required. The absence of pain does not always mean that the stone has passed. It may have only stopped moving. If the urine remains completely obstructed, it can cause loss of kidney function or even complete destruction of the kidney. It is your responsibility to make sure X-rays and follow-ups are completed. Ultrasounds of the kidney can show blockages and the status of the kidney. Ultrasounds are not associated with any radiation and can be performed easily in a matter of minutes. °· Make changes to your daily diet as told by your health care provider. You may be told to: °¨ Limit the amount of salt that you eat. °¨ Eat 5 or more servings of fruits and vegetables each day. °¨ Limit the amount of meat, poultry, fish, and eggs that you eat. °· Collect a 24-hour urine sample as told by your health care provider. You may need to collect another urine sample every 6-12 months. °SEEK MEDICAL CARE IF: °· You experience pain that is progressive and unresponsive to any pain medicine you have been prescribed. °SEEK IMMEDIATE MEDICAL CARE IF:  °· Pain cannot be controlled with the prescribed  medicine. °· You have a fever or shaking chills. °· The severity or intensity of pain increases over 18 hours and is not relieved by pain medicine. °· You develop a new onset of abdominal pain. °· You feel faint or pass out. °· You are unable to urinate. °  °This information is not intended to replace advice given to you by your health care provider. Make sure you discuss any questions you have with your health care provider. °  °Document Released: 01/10/2005 Document Revised: 10/01/2014 Document Reviewed: 06/13/2012 °Elsevier Interactive Patient Education ©2016 [employer] ° °

## 2015-12-26 ENCOUNTER — Encounter (HOSPITAL_COMMUNITY): Payer: Self-pay | Admitting: Family Medicine

## 2015-12-26 ENCOUNTER — Emergency Department (HOSPITAL_COMMUNITY): Payer: BLUE CROSS/BLUE SHIELD

## 2015-12-26 ENCOUNTER — Observation Stay (HOSPITAL_COMMUNITY)
Admission: EM | Admit: 2015-12-26 | Discharge: 2015-12-27 | Disposition: A | Discharge status: Home / Self Care | Payer: BLUE CROSS/BLUE SHIELD | Attending: Internal Medicine | Admitting: Internal Medicine

## 2015-12-26 DIAGNOSIS — R1031 Right lower quadrant pain: Secondary | ICD-10-CM | POA: Diagnosis present

## 2015-12-26 DIAGNOSIS — N10 Acute tubulo-interstitial nephritis: Secondary | ICD-10-CM | POA: Diagnosis not present

## 2015-12-26 DIAGNOSIS — M549 Dorsalgia, unspecified: Secondary | ICD-10-CM | POA: Diagnosis not present

## 2015-12-26 DIAGNOSIS — E876 Hypokalemia: Secondary | ICD-10-CM | POA: Insufficient documentation

## 2015-12-26 DIAGNOSIS — Z79899 Other long term (current) drug therapy: Secondary | ICD-10-CM | POA: Diagnosis not present

## 2015-12-26 DIAGNOSIS — M545 Low back pain: Secondary | ICD-10-CM

## 2015-12-26 DIAGNOSIS — G8929 Other chronic pain: Secondary | ICD-10-CM | POA: Diagnosis not present

## 2015-12-26 DIAGNOSIS — N200 Calculus of kidney: Secondary | ICD-10-CM

## 2015-12-26 DIAGNOSIS — G43909 Migraine, unspecified, not intractable, without status migrainosus: Secondary | ICD-10-CM | POA: Diagnosis present

## 2015-12-26 LAB — CBC
HEMATOCRIT: 37.7 % (ref 36.0–46.0)
HEMOGLOBIN: 12.1 g/dL (ref 12.0–15.0)
MCH: 24.9 pg — ABNORMAL LOW (ref 26.0–34.0)
MCHC: 32.1 g/dL (ref 30.0–36.0)
MCV: 77.6 fL — AB (ref 78.0–100.0)
PLATELETS: 187 10*3/uL (ref 150–400)
RBC: 4.86 MIL/uL (ref 3.87–5.11)
RDW: 14.7 % (ref 11.5–15.5)
WBC: 15.6 10*3/uL — AB (ref 4.0–10.5)

## 2015-12-26 LAB — URINALYSIS, ROUTINE W REFLEX MICROSCOPIC
Bilirubin Urine: NEGATIVE
GLUCOSE, UA: NEGATIVE mg/dL
KETONES UR: NEGATIVE mg/dL
Nitrite: POSITIVE — AB
Specific Gravity, Urine: 1.018 (ref 1.005–1.030)
pH: 6 (ref 5.0–8.0)

## 2015-12-26 LAB — COMPREHENSIVE METABOLIC PANEL
ALT: 18 U/L (ref 14–54)
ANION GAP: 7 (ref 5–15)
AST: 20 U/L (ref 15–41)
Albumin: 3.5 g/dL (ref 3.5–5.0)
Alkaline Phosphatase: 77 U/L (ref 38–126)
BUN: 8 mg/dL (ref 6–20)
CHLORIDE: 105 mmol/L (ref 101–111)
CO2: 26 mmol/L (ref 22–32)
CREATININE: 0.9 mg/dL (ref 0.44–1.00)
Calcium: 8.9 mg/dL (ref 8.9–10.3)
Glucose, Bld: 102 mg/dL — ABNORMAL HIGH (ref 65–99)
POTASSIUM: 3.3 mmol/L — AB (ref 3.5–5.1)
Sodium: 138 mmol/L (ref 135–145)
Total Bilirubin: 0.4 mg/dL (ref 0.3–1.2)
Total Protein: 7 g/dL (ref 6.5–8.1)

## 2015-12-26 LAB — URINE MICROSCOPIC-ADD ON

## 2015-12-26 LAB — HCG, QUANTITATIVE, PREGNANCY: hCG, Beta Chain, Quant, S: <1 m[IU]/mL (ref <5)

## 2015-12-26 LAB — LACTIC ACID, PLASMA: LACTIC ACID, VENOUS: 1.2 mmol/L (ref 0.5–1.9)

## 2015-12-26 MED ORDER — ONDANSETRON 4 MG PO TBDP
ORAL_TABLET | ORAL | Status: AC
  Filled 2015-12-26: qty 1

## 2015-12-26 MED ORDER — HYDROCODONE-ACETAMINOPHEN 5-325 MG PO TABS: 2.0000 | ORAL_TABLET | ORAL | Status: DC | PRN

## 2015-12-26 MED ORDER — HYDROMORPHONE HCL 2 MG/ML IJ SOLN
1.0000 mg | Freq: Once | INTRAMUSCULAR | Status: AC
  Administered 2015-12-26: 1 mg via INTRAVENOUS
  Filled 2015-12-26: qty 1

## 2015-12-26 MED ORDER — CYCLOBENZAPRINE HCL 10 MG PO TABS: 10.0000 mg | ORAL_TABLET | Freq: Three times a day (TID) | ORAL | Status: DC | PRN

## 2015-12-26 MED ORDER — HYDROMORPHONE HCL 2 MG/ML IJ SOLN: 1.0000 mg | INTRAMUSCULAR | Status: DC | PRN

## 2015-12-26 MED ORDER — MUPIROCIN CALCIUM 2 % EX CREA: 1.0000 "application " | TOPICAL_CREAM | Freq: Two times a day (BID) | CUTANEOUS | Status: DC

## 2015-12-26 MED ORDER — ADULT MULTIVITAMIN W/MINERALS CH: 1.0000 | ORAL_TABLET | Freq: Every day | ORAL | Status: DC

## 2015-12-26 MED ORDER — HYDROMORPHONE HCL 1 MG/ML IJ SOLN
INTRAMUSCULAR | Status: AC
  Administered 2015-12-26: 23:00:00
  Filled 2015-12-26: qty 1

## 2015-12-26 MED ORDER — CEFTRIAXONE SODIUM 1 G IJ SOLR
1.0000 g | Freq: Once | INTRAMUSCULAR | Status: AC
  Administered 2015-12-26: 1 g via INTRAVENOUS
  Filled 2015-12-26: qty 10

## 2015-12-26 MED ORDER — ONDANSETRON 4 MG PO TBDP
4.0000 mg | ORAL_TABLET | Freq: Three times a day (TID) | ORAL | Status: AC | PRN
  Administered 2015-12-26: 4 mg via ORAL

## 2015-12-26 MED ORDER — POTASSIUM CHLORIDE CRYS ER 20 MEQ PO TBCR
40.0000 meq | EXTENDED_RELEASE_TABLET | Freq: Once | ORAL | Status: DC
  Filled 2015-12-26: qty 2

## 2015-12-26 MED ORDER — ONDANSETRON 4 MG PO TBDP
4.0000 mg | ORAL_TABLET | Freq: Once | ORAL | Status: AC | PRN
  Administered 2015-12-26: 4 mg via ORAL

## 2015-12-26 MED ORDER — SODIUM CHLORIDE 0.9 % IV SOLN
INTRAVENOUS | Status: DC
  Administered 2015-12-26 – 2015-12-27 (×3): via INTRAVENOUS

## 2015-12-26 MED ORDER — PROMETHAZINE HCL 25 MG PO TABS
25.0000 mg | ORAL_TABLET | Freq: Once | ORAL | Status: AC
  Administered 2015-12-26: 25 mg via ORAL

## 2015-12-26 MED ORDER — VITAMIN C 500 MG/5ML PO SYRP: 500.0000 mg | ORAL_SOLUTION | Freq: Every day | ORAL | Status: DC

## 2015-12-26 MED ORDER — ALPRAZOLAM 0.25 MG PO TABS: 0.2500 mg | ORAL_TABLET | Freq: Three times a day (TID) | ORAL | Status: DC | PRN

## 2015-12-26 MED ORDER — ACETAMINOPHEN 650 MG RE SUPP: 650.0000 mg | Freq: Four times a day (QID) | RECTAL | Status: DC | PRN

## 2015-12-26 MED ORDER — PROMETHAZINE HCL 25 MG PO TABS
ORAL_TABLET | ORAL | Status: AC
  Administered 2015-12-26
  Filled 2015-12-26: qty 1

## 2015-12-26 MED ORDER — SODIUM CHLORIDE 0.9 % IV SOLN: INTRAVENOUS | Status: DC

## 2015-12-26 MED ORDER — ASPIRIN-ACETAMINOPHEN-CAFFEINE 250-250-65 MG PO TABS
1.0000 | ORAL_TABLET | Freq: Four times a day (QID) | ORAL | Status: DC | PRN
  Filled 2015-12-26: qty 1

## 2015-12-26 MED ORDER — ASPIRIN-ACETAMINOPHEN-CAFFEINE 250-250-65 MG PO TABS
2.0000 | ORAL_TABLET | Freq: Once | ORAL | Status: AC
  Administered 2015-12-27: 2 via ORAL
  Filled 2015-12-26: qty 2

## 2015-12-26 MED ORDER — METOCLOPRAMIDE HCL 5 MG/ML IJ SOLN
5.0000 mg | Freq: Once | INTRAMUSCULAR | Status: AC
  Administered 2015-12-26: 5 mg via INTRAVENOUS
  Filled 2015-12-26: qty 2

## 2015-12-26 MED ORDER — ACETAMINOPHEN 325 MG PO TABS: 650.0000 mg | ORAL_TABLET | Freq: Four times a day (QID) | ORAL | Status: DC | PRN

## 2015-12-26 MED ORDER — ENOXAPARIN SODIUM 40 MG/0.4ML ~~LOC~~ SOLN
SUBCUTANEOUS | Status: AC
  Administered 2015-12-26
  Filled 2015-12-26: qty 0.4

## 2015-12-26 MED ORDER — NAPROXEN 250 MG PO TABS: 250.0000 mg | ORAL_TABLET | Freq: Two times a day (BID) | ORAL | Status: DC

## 2015-12-26 MED ORDER — HYDROMORPHONE HCL 1 MG/ML IJ SOLN
1.0000 mg | INTRAMUSCULAR | Status: DC | PRN
  Administered 2015-12-26 – 2015-12-27 (×3): 1 mg via INTRAVENOUS
  Filled 2015-12-26 (×2): qty 1

## 2015-12-26 MED ORDER — SODIUM CHLORIDE 0.9 % IV BOLUS (SEPSIS)
2000.0000 mL | Freq: Once | INTRAVENOUS | Status: AC
  Administered 2015-12-26: 2000 mL via INTRAVENOUS

## 2015-12-26 MED ORDER — INFLUENZA VAC SPLIT QUAD 0.5 ML IM SUSY
0.5000 mL | PREFILLED_SYRINGE | INTRAMUSCULAR | Status: AC
  Administered 2015-12-27: 0.5 mL via INTRAMUSCULAR
  Filled 2015-12-26: qty 0.5

## 2015-12-26 MED ORDER — ZOLPIDEM TARTRATE 5 MG PO TABS: 5.0000 mg | ORAL_TABLET | Freq: Every evening | ORAL | Status: DC | PRN

## 2015-12-26 MED ORDER — ONDANSETRON 4 MG PO TBDP
ORAL_TABLET | ORAL | Status: AC
  Administered 2015-12-26: 23:00:00
  Filled 2015-12-26: qty 1

## 2015-12-26 MED ORDER — POTASSIUM CHLORIDE CRYS ER 20 MEQ PO TBCR
EXTENDED_RELEASE_TABLET | ORAL | Status: AC
  Filled 2015-12-26: qty 2

## 2015-12-26 MED ORDER — ENOXAPARIN SODIUM 40 MG/0.4ML ~~LOC~~ SOLN
40.0000 mg | SUBCUTANEOUS | Status: DC
  Administered 2015-12-26: 40 mg via SUBCUTANEOUS

## 2015-12-26 MED ORDER — ONDANSETRON HCL 4 MG PO TABS
ORAL_TABLET | ORAL | Status: AC
  Filled 2015-12-26: qty 1

## 2015-12-26 MED ORDER — BIOTIN 5 MG PO CAPS: 5.0000 mg | ORAL_CAPSULE | Freq: Every day | ORAL | Status: DC

## 2015-12-26 MED ORDER — DEXTROSE 5 % IV SOLN
1.0000 g | INTRAVENOUS | Status: DC
  Administered 2015-12-27: 1 g via INTRAVENOUS
  Filled 2015-12-26 (×2): qty 10

## 2015-12-26 NOTE — ED Notes (Signed)
Pt transported to CT ?

## 2015-12-26 NOTE — ED Provider Notes (Signed)
[facility]-EMERGENCY DEPT Provider Note   CSN: [id number] Arrival date & time: 12/26/15  1347     History   Chief Complaint Chief Complaint  Patient presents with  . Flank Pain  . Abdominal Pain    HPI Mary Morse is a 35 y.o. female.Complains of right flank pain rating to right lower quadrant of abdomen onset this morning. Symptoms associated with one episode of vomiting and nausea at present. She is treated herself with ibuprofen and Tylenol, without relief. She denies any fever. Nausea is mild presently is being treated with Zofran prior to my exam. Pain is severe. Nothing makes symptoms better or worse. Feels like kidney stone she's had in the past. No other associated symptoms  HPI  Past Medical History:  Diagnosis Date  . Anxiety   . Borderline diabetes   . Chronic back pain   . Complication of anesthesia    felt that the first surgery she had like she had a hard time waking up.  . Constipation   . Kidney stone   . Migraine    "2-3 times/month" (12/06/2012)    Patient Active Problem List   Diagnosis Date Noted  . Gallstones 11/13/2012  . Renal calculus, right 11/11/2012    Past Surgical History:  Procedure Laterality Date  . ABDOMINAL HYSTERECTOMY  05/2011  . CHOLECYSTECTOMY N/A 12/06/2012   Procedure: LAPAROSCOPIC CHOLECYSTECTOMY WITH INTRAOPERATIVE CHOLANGIOGRAM;  Surgeon: Robyne Askew, MD;  Location: [facility];  Service: General;  Laterality: N/A;  . LAPAROSCOPIC CHOLECYSTECTOMY  12/06/2012   w/IOC/notes 12/06/2012  . LITHOTRIPSY  ~ 2010; 10/2012  . REDUCTION MAMMAPLASTY Bilateral 2013  . TUBAL LIGATION  2007    OB History    No data available       Home Medications    Prior to Admission medications   Medication Sig Start Date End Date Taking? Authorizing Provider  Ascorbic Acid (VITAMIN C PO) Take 1 tablet by mouth daily.    Historical Provider, MD  Biotin (BIOTIN 5000) 5 MG CAPS Take 5 mg by mouth daily.    Historical Provider, MD  cyclobenzaprine  (FLEXERIL) 10 MG tablet Take 1 tablet (10 mg total) by mouth 3 (three) times daily as needed for muscle spasms. 03/22/15   Gilda Crease, MD  HYDROcodone-acetaminophen (NORCO/VICODIN) 5-325 MG tablet Take 2 tablets by mouth every 4 (four) hours as needed for moderate pain. Patient not taking: Reported on 04/05/2015 03/22/15   Gilda Crease, MD  meloxicam (MOBIC) 7.5 MG tablet Take 2 tablets (15 mg total) by mouth daily. Patient not taking: Reported on 04/05/2015 03/22/15   Joycie Peek, PA-C  Multiple Vitamin (MULTIVITAMIN WITH MINERALS) TABS tablet Take 1 tablet by mouth daily.    Historical Provider, MD  mupirocin cream (BACTROBAN) 2 % Apply 1 application topically 2 (two) times daily. Patient not taking: Reported on 04/05/2015 03/22/15   Gilda Crease, MD  naproxen (NAPROSYN) 250 MG tablet Take 1 tablet (250 mg total) by mouth 2 (two) times daily with a meal. 04/06/15   Everlene Farrier, PA-C  Oxycodone HCl 10 MG TABS Take 10 mg by mouth every 4 (four) hours as needed (for pain).    Historical Provider, MD  promethazine (PHENERGAN) 25 MG tablet Take 25 mg by mouth every 6 (six) hours as needed for nausea.     Historical Provider, MD    Family History Family History  Problem Relation Age of Onset  . Cancer Father     prostate  . Cancer  Paternal Grandmother     breast    Social History Social History  Substance Use Topics  . Smoking status: Never Smoker  . Smokeless tobacco: Never Used  . Alcohol use No     Allergies   Patient has no known allergies.   Review of Systems Review of Systems  Constitutional: Negative.   HENT: Negative.   Respiratory: Negative.   Cardiovascular: Negative.   Gastrointestinal: Positive for abdominal pain, nausea and vomiting.  Genitourinary: Positive for flank pain.  Skin: Negative.   Neurological: Negative.   Psychiatric/Behavioral: Negative.   All other systems reviewed and are negative.    Physical Exam Updated Vital  Signs BP 111/72   Pulse 80   Temp 98.2 F (36.8 C) (Oral)   Resp 16   SpO2 100%   Physical Exam  Constitutional: She appears well-developed and well-nourished.  HENT:  Head: Normocephalic and atraumatic.  Eyes: Conjunctivae are normal. Pupils are equal, round, and reactive to light.  Neck: Neck supple. No tracheal deviation present. No thyromegaly present.  Cardiovascular: Normal rate and regular rhythm.   No murmur heard. Pulmonary/Chest: Effort normal and breath sounds normal.  Abdominal: Soft. Bowel sounds are normal. She exhibits no distension. There is no tenderness.  Obese  Genitourinary:  Genitourinary Comments: Mild right flank tenderness  Musculoskeletal: Normal range of motion. She exhibits no edema or tenderness.  Neurological: She is alert. Coordination normal.  Skin: Skin is warm and dry. No rash noted.  Psychiatric: She has a normal mood and affect.  Nursing note and vitals reviewed.    ED Treatments / Results  Labs (all labs ordered are listed, but only abnormal results are displayed) Labs Reviewed  URINALYSIS, ROUTINE W REFLEX MICROSCOPIC (NOT AT [facility]) - Abnormal; Notable for the following:       Result Value   APPearance TURBID (*)    Hgb urine dipstick LARGE (*)    Protein, ur >300 (*)    Nitrite POSITIVE (*)    Leukocytes, UA LARGE (*)    All other components within normal limits  CBC - Abnormal; Notable for the following:    WBC 15.6 (*)    MCV 77.6 (*)    MCH 24.9 (*)    All other components within normal limits  URINE MICROSCOPIC-ADD ON - Abnormal; Notable for the following:    Squamous Epithelial / LPF 0-5 (*)    Bacteria, UA FEW (*)    All other components within normal limits  URINE CULTURE  COMPREHENSIVE METABOLIC PANEL    EKG  EKG Interpretation None       Radiology No results found.  Procedures Procedures (including critical care time)  Medications Ordered in ED Medications  HYDROmorphone (DILAUDID) injection 1 mg (not  administered)  metoCLOPramide (REGLAN) injection 5 mg (not administered)  ondansetron (ZOFRAN-ODT) disintegrating tablet 4 mg (4 mg Oral Given 12/26/15 1542)    Results for orders placed or performed during the hospital encounter of 12/26/15  Urinalysis, Routine w reflex microscopic (not at [facility])  Result Value Ref Range   Color, Urine YELLOW YELLOW   APPearance TURBID (A) CLEAR   Specific Gravity, Urine 1.018 1.005 - 1.030   pH 6.0 5.0 - 8.0   Glucose, UA NEGATIVE NEGATIVE mg/dL   Hgb urine dipstick LARGE (A) NEGATIVE   Bilirubin Urine NEGATIVE NEGATIVE   Ketones, ur NEGATIVE NEGATIVE mg/dL   Protein, ur >161>300 (A) NEGATIVE mg/dL   Nitrite POSITIVE (A) NEGATIVE   Leukocytes, UA LARGE (A) NEGATIVE  Comprehensive metabolic panel  Result Value Ref Range   Sodium 138 135 - 145 mmol/L   Potassium 3.3 (L) 3.5 - 5.1 mmol/L   Chloride 105 101 - 111 mmol/L   CO2 26 22 - 32 mmol/L   Glucose, Bld 102 (H) 65 - 99 mg/dL   BUN 8 6 - 20 mg/dL   Creatinine, Ser 4.09 0.44 - 1.00 mg/dL   Calcium 8.9 8.9 - 81.1 mg/dL   Total Protein 7.0 6.5 - 8.1 g/dL   Albumin 3.5 3.5 - 5.0 g/dL   AST 20 15 - 41 U/L   ALT 18 14 - 54 U/L   Alkaline Phosphatase 77 38 - 126 U/L   Total Bilirubin 0.4 0.3 - 1.2 mg/dL   GFR calc non Af Amer >60 >60 mL/min   GFR calc Af Amer >60 >60 mL/min   Anion gap 7 5 - 15  CBC  Result Value Ref Range   WBC 15.6 (H) 4.0 - 10.5 K/uL   RBC 4.86 3.87 - 5.11 MIL/uL   Hemoglobin 12.1 12.0 - 15.0 g/dL   HCT 91.4 78.2 - 95.6 %   MCV 77.6 (L) 78.0 - 100.0 fL   MCH 24.9 (L) 26.0 - 34.0 pg   MCHC 32.1 30.0 - 36.0 g/dL   RDW 21.3 08.6 - 57.8 %   Platelets 187 150 - 400 K/uL  Urine microscopic-add on  Result Value Ref Range   Squamous Epithelial / LPF 0-5 (A) NONE SEEN   WBC, UA TOO NUMEROUS TO COUNT 0 - 5 WBC/hpf   RBC / HPF TOO NUMEROUS TO COUNT 0 - 5 RBC/hpf   Bacteria, UA FEW (A) NONE SEEN   Ct Renal Stone Study  Result Date: 12/26/2015 CLINICAL DATA:  Right flank pain  radiating toward the abdomen since this morning. History of nephrolithiasis in the past. EXAM: CT ABDOMEN AND PELVIS WITHOUT CONTRAST TECHNIQUE: Multidetector CT imaging of the abdomen and pelvis was performed following the standard protocol without IV contrast. COMPARISON:  Renal ultrasound dated 04/05/2015 and abdomen and pelvis CT dated 01/21/2013. FINDINGS: Lower chest: No acute abnormality. Hepatobiliary: Cholecystectomy clips.  Normal appearing liver. Pancreas: Unremarkable. No pancreatic ductal dilatation or surrounding inflammatory changes. Spleen: Normal in size without focal abnormality. Adrenals/Urinary Tract: 7 mm and 4 mm lower pole right renal calculi. Stable dilated right renal appear meds without interval hydronephrosis. A mildly asymmetrically prominent right renal pelvis is again demonstrated. There is associated diffuse mucosal thickening. No bladder, ureteral or left renal calculi. Bilateral pelvic phleboliths are noted. Stomach/Bowel: Mild colonic diverticulosis. Normal appearing appendix, small bowel and stomach. Vascular/Lymphatic: No significant vascular findings are present. No enlarged abdominal or pelvic lymph nodes. Reproductive: Surgically absent uterus. Normal appearing left ovary. The right ovary is not definitely visualized. Other: Small umbilical hernia containing fat. Musculoskeletal: Minimal lumbar and lower thoracic spine degenerative changes. IMPRESSION: 1. Nonobstructing lower pole right renal calculi. 2. 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 3. Mild diffuse right renal pelvis mucosal thickening. This most likely due to previous obstruction. Infection could also produce this appearance. 4. Mild colonic diverticulosis. Electronically Signed   By: Beckie Salts M.D.   On: 12/26/2015 18:42   Initial Impression / Assessment and Plan / ED Course  I have reviewed the triage vital signs and the nursing notes.  Pertinent labs &  imaging results that were available during my care of the patient were reviewed by me and considered in my medical  decision making (see chart for details).  Clinical Course     5:25 PM nausea is resolved after treatment with intravenous Reglan. Pain is somewhat improved after treatment with IV hydromorphone however requesting more pain medicine. Additional hydromorphone IV ordered 7:40 PM patient continues to have significant pain after treatment with 3 doses of intravenous hydromorphone. Dr.Niu consulted and saw patient in the ED and took over care at 7:45 PM plan 23 hour observation MedSurg floor. Intravenous antibiotics. Pain control. Oral potassium ordered Final Clinical Impressions(s) / ED Diagnoses  Diagnosis#1 acute pyelonephritis #2 hypokalemia Final diagnoses:  None    New Prescriptions New Prescriptions   No medications on file     Doug SouSam Schawn Byas, MD 12/26/15 2338

## 2015-12-26 NOTE — ED Triage Notes (Signed)
Pt here for right flank pain radiating into right lower abdomen since this am. sts she has a kidney stone presently.

## 2015-12-26 NOTE — H&P (Signed)
History and Physical    Karolee Meloni ZOX:[id number] DOB: [birth year] DOA: 12/26/2015  Referring MD/NP/PA:   PCP: No PCP Per Patient   Patient coming from:  The patient is coming from home.  At baseline, pt is independent for most of ADL.   Chief Complaint: Right flank pain  HPI: Jini Horiuchi is a 35 y.o. female with medical history significant of kidney stone(s/p of lithotripsy for 3 times), migraine headache, chronic back pain, anxiety, who presents with right flank pain.  Patient states that she started having right flank pain since this morning. It is constant, sharp, 10 out of 10 in severity, radiating into the right lower abdomen. She has dysuria, no hematuria. She has nausea and vomited once. She had one loose stool earlier, currently no diarrhea. No fever, but has chills. Patient denies chest pain, shortness of breath, unilateral weakness.  ED Course: pt was found to have WBC 15.6, posterior urinalysis with large amount of leukocytes and positive nitrates, electrolytes renal function okay, temperature normal, no tachycardia, no tachypnea, oxygen saturation 99% on room air, soft blood pressure. Pt is placed on MedSurg bed for observation  # CT scan per renal stone protocol showed 1.Nonobstructing lower pole right renal calculi. 2. 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 3. Mild diffuse right renal pelvis mucosal thickening. 4. Mild colonic diverticulosis.  Review of Systems:   General: no fevers, has chills, no changes in body weight, has poor appetite, has fatigue HEENT: no blurry vision, hearing changes or sore throat Respiratory: no dyspnea, coughing, wheezing CV: no chest pain, no palpitations GI: has nausea, vomiting, abdominal pain, No diarrhea, constipation GU: has dysuria, no hematuria  Ext: no leg edema Neuro: no unilateral weakness, numbness, or tingling, no vision change or hearing loss Skin: no rash, no skin  tear. MSK: No muscle spasm, no deformity, no limitation of range of movement in spin Heme: No easy bruising.  Travel history: No recent long distant travel.  Allergy: No Known Allergies  Past Medical History:  Diagnosis Date  . Anxiety   . Borderline diabetes   . Chronic back pain   . Complication of anesthesia    felt that the first surgery she had like she had a hard time waking up.  . Constipation   . Kidney stone   . Migraine    "2-3 times/month" (12/06/2012)    Past Surgical History:  Procedure Laterality Date  . ABDOMINAL HYSTERECTOMY  05/2011  . CHOLECYSTECTOMY N/A 12/06/2012   Procedure: LAPAROSCOPIC CHOLECYSTECTOMY WITH INTRAOPERATIVE CHOLANGIOGRAM;  Surgeon: Robyne Askew, MD;  Location: [facility] OR;  Service: General;  Laterality: N/A;  . LAPAROSCOPIC CHOLECYSTECTOMY  12/06/2012   w/IOC/notes 12/06/2012  . LITHOTRIPSY  ~ 2010; 10/2012  . REDUCTION MAMMAPLASTY Bilateral 2013  . TUBAL LIGATION  2007    Social History:  reports that she has never smoked. She has never used smokeless tobacco. She reports that she does not drink alcohol or use drugs.  Family History:  Family History  Problem Relation Age of Onset  . Cancer Father     prostate  . Cancer Paternal Grandmother     breast     Prior to Admission medications   Medication Sig Start Date End Date Taking? Authorizing Provider  Ascorbic Acid (VITAMIN C PO) Take 1 tablet by mouth daily.    Historical Provider, MD  Biotin (BIOTIN 5000) 5 MG CAPS Take 5 mg by mouth daily.    Historical  Provider, MD  cyclobenzaprine (FLEXERIL) 10 MG tablet Take 1 tablet (10 mg total) by mouth 3 (three) times daily as needed for muscle spasms. 03/22/15   Gilda Creasehristopher J Pollina, MD  HYDROcodone-acetaminophen (NORCO/VICODIN) 5-325 MG tablet Take 2 tablets by mouth every 4 (four) hours as needed for moderate pain. Patient not taking: Reported on 04/05/2015 03/22/15   Gilda Creasehristopher J Pollina, MD  meloxicam (MOBIC) 7.5 MG tablet Take 2  tablets (15 mg total) by mouth daily. Patient not taking: Reported on 04/05/2015 03/22/15   Joycie PeekBenjamin Cartner, PA-C  Multiple Vitamin (MULTIVITAMIN WITH MINERALS) TABS tablet Take 1 tablet by mouth daily.    Historical Provider, MD  mupirocin cream (BACTROBAN) 2 % Apply 1 application topically 2 (two) times daily. Patient not taking: Reported on 04/05/2015 03/22/15   Gilda Creasehristopher J Pollina, MD  naproxen (NAPROSYN) 250 MG tablet Take 1 tablet (250 mg total) by mouth 2 (two) times daily with a meal. 04/06/15   Everlene FarrierWilliam Dansie, PA-C  Oxycodone HCl 10 MG TABS Take 10 mg by mouth every 4 (four) hours as needed (for pain).    Historical Provider, MD  promethazine (PHENERGAN) 25 MG tablet Take 25 mg by mouth every 6 (six) hours as needed for nausea.     Historical Provider, MD    Physical Exam: Vitals:   12/26/15 1554 12/26/15 1623 12/26/15 1716 12/26/15 1752  BP: 102/67 111/72 95/55 98/62   Pulse: 78 80 65 78  Resp: 18 16 16    Temp: 98.2 F (36.8 C)     TempSrc: Oral     SpO2: 100% 100% 100% 99%   General: Not in acute distress HEENT:       Eyes: PERRL, EOMI, no scleral icterus.       ENT: No discharge from the ears and nose, no pharynx injection, no tonsillar enlargement.        Neck: No JVD, no bruit, no mass felt. Heme: No neck lymph node enlargement. Cardiac: S1/S2, RRR, No murmurs, No gallops or rubs. Respiratory:  No rales, wheezing, rhonchi or rubs. GI: Soft, nondistended, nontender, no rebound pain, no organomegaly, BS present. Positive R CVA tenderness. GU: No hematuria Ext: No pitting leg edema bilaterally. 2+DP/PT pulse bilaterally. Musculoskeletal: No joint deformities, No joint redness or warmth, no limitation of ROM in spin. Skin: No rashes.  Neuro: Alert, oriented X3, cranial nerves II-XII grossly intact, moves all extremities normally. Psych: Patient is not psychotic, no suicidal or hemocidal ideation.  Labs on Admission: I have personally reviewed following labs and imaging  studies  CBC:  Recent Labs Lab 12/26/15 1539  WBC 15.6*  HGB 12.1  HCT 37.7  MCV 77.6*  PLT 187   Basic Metabolic Panel:  Recent Labs Lab 12/26/15 1539  NA 138  K 3.3*  CL 105  CO2 26  GLUCOSE 102*  BUN 8  CREATININE 0.90  CALCIUM 8.9   GFR: CrCl cannot be calculated (Unknown ideal weight.). Liver Function Tests:  Recent Labs Lab 12/26/15 1539  AST 20  ALT 18  ALKPHOS 77  BILITOT 0.4  PROT 7.0  ALBUMIN 3.5   No results for input(s): LIPASE, AMYLASE in the last 168 hours. No results for input(s): AMMONIA in the last 168 hours. Coagulation Profile: No results for input(s): INR, PROTIME in the last 168 hours. Cardiac Enzymes: No results for input(s): CKTOTAL, CKMB, CKMBINDEX, TROPONINI in the last 168 hours. BNP (last 3 results) No results for input(s): PROBNP in the last 8760 hours. HbA1C: No results for input(s): HGBA1C  in the last 72 hours. CBG: No results for input(s): GLUCAP in the last 168 hours. Lipid Profile: No results for input(s): CHOL, HDL, LDLCALC, TRIG, CHOLHDL, LDLDIRECT in the last 72 hours. Thyroid Function Tests: No results for input(s): TSH, T4TOTAL, FREET4, T3FREE, THYROIDAB in the last 72 hours. Anemia Panel: No results for input(s): VITAMINB12, FOLATE, FERRITIN, TIBC, IRON, RETICCTPCT in the last 72 hours. Urine analysis:    Component Value Date/Time   COLORURINE YELLOW 12/26/2015 1428   APPEARANCEUR TURBID (A) 12/26/2015 1428   LABSPEC 1.018 12/26/2015 1428   PHURINE 6.0 12/26/2015 1428   GLUCOSEU NEGATIVE 12/26/2015 1428   HGBUR LARGE (A) 12/26/2015 1428   BILIRUBINUR NEGATIVE 12/26/2015 1428   KETONESUR NEGATIVE 12/26/2015 1428   PROTEINUR >300 (A) 12/26/2015 1428   UROBILINOGEN 0.2 11/09/2012 1919   NITRITE POSITIVE (A) 12/26/2015 1428   LEUKOCYTESUR LARGE (A) 12/26/2015 1428   Sepsis Labs: @LABRCNTIP (procalcitonin:4,lacticidven:4) )No results found for this or any previous visit (from the past 240 hour(s)).    Radiological Exams on Admission: Ct Renal Stone Study  Result Date: 12/26/2015 CLINICAL DATA:  Right flank pain radiating toward the abdomen since this morning. History of nephrolithiasis in the past. EXAM: CT ABDOMEN AND PELVIS WITHOUT CONTRAST TECHNIQUE: Multidetector CT imaging of the abdomen and pelvis was performed following the standard protocol without IV contrast. COMPARISON:  Renal ultrasound dated 04/05/2015 and abdomen and pelvis CT dated 01/21/2013. FINDINGS: Lower chest: No acute abnormality. Hepatobiliary: Cholecystectomy clips.  Normal appearing liver. Pancreas: Unremarkable. No pancreatic ductal dilatation or surrounding inflammatory changes. Spleen: Normal in size without focal abnormality. Adrenals/Urinary Tract: 7 mm and 4 mm lower pole right renal calculi. Stable dilated right renal appear meds without interval hydronephrosis. A mildly asymmetrically prominent right renal pelvis is again demonstrated. There is associated diffuse mucosal thickening. No bladder, ureteral or left renal calculi. Bilateral pelvic phleboliths are noted. Stomach/Bowel: Mild colonic diverticulosis. Normal appearing appendix, small bowel and stomach. Vascular/Lymphatic: No significant vascular findings are present. No enlarged abdominal or pelvic lymph nodes. Reproductive: Surgically absent uterus. Normal appearing left ovary. The right ovary is not definitely visualized. Other: Small umbilical hernia containing fat. Musculoskeletal: Minimal lumbar and lower thoracic spine degenerative changes. IMPRESSION: 1. Nonobstructing lower pole right renal calculi. 2. 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 3. Mild diffuse right renal pelvis mucosal thickening. This most likely due to previous obstruction. Infection could also produce this appearance. 4. Mild colonic diverticulosis. Electronically Signed   By: Beckie SaltsSteven  Reid M.D.   On: 12/26/2015 18:42     EKG: Not done  in ED, will get one.   Assessment/Plan Principal Problem:   Acute pyelonephritis Active Problems:   Renal calculus, right   Chronic back pain   Migraine   Pyelonephritis: This is complicated by kidney stone. Patient states that she saw urologist in the past and had lithotripsy for 3 times. No hydronephrosis. Patient has leukocytosis, no fever, does not meet criteria for sepsis currently, but lactate is pending. If lactate is elevated, she will meet criteria for sepsis.   - place on med-surg bed for obs - Ceftriaxone by IV - Follow up results of urine and blood cx and amend antibiotic regimen if needed per sensitivity results - prn Zofran for nausea - prn dilaudid and Norco for pain - pt is also on naproxen for chronic back pain - will get Procalcitonin and trend lactic acid levels - IVF: 2L of NS bolus in ED, followed by  125 cc/h   Chronic back pain -prn Norco and naproxen  Hx of migraine: -prn Excedrin   DVT ppx: SQ Lovenox Code Status: Full code Family Communication: None at bed side. Disposition Plan:  Anticipate discharge back to previous home environment Consults called:  none Admission status: medical floor/obs  Date of Service 12/26/2015    Lorretta Harp [facility] Pager [phone]  If 7PM-7AM, please contact night-coverage [email or web address] Password [email or web address] 12/26/2015, 8:16 PM

## 2015-12-27 DIAGNOSIS — M545 Low back pain: Secondary | ICD-10-CM

## 2015-12-27 DIAGNOSIS — N200 Calculus of kidney: Secondary | ICD-10-CM | POA: Diagnosis not present

## 2015-12-27 DIAGNOSIS — G43919 Migraine, unspecified, intractable, without status migrainosus: Secondary | ICD-10-CM | POA: Diagnosis not present

## 2015-12-27 DIAGNOSIS — N10 Acute tubulo-interstitial nephritis: Secondary | ICD-10-CM | POA: Diagnosis not present

## 2015-12-27 DIAGNOSIS — G8929 Other chronic pain: Secondary | ICD-10-CM

## 2015-12-27 LAB — MAGNESIUM: Magnesium: 1.7 mg/dL (ref 1.7–2.4)

## 2015-12-27 LAB — CBC
HEMATOCRIT: 33.9 % — AB (ref 36.0–46.0)
Hemoglobin: 10.9 g/dL — ABNORMAL LOW (ref 12.0–15.0)
MCH: 25.1 pg — AB (ref 26.0–34.0)
MCHC: 32.2 g/dL (ref 30.0–36.0)
MCV: 77.9 fL — AB (ref 78.0–100.0)
PLATELETS: 154 10*3/uL (ref 150–400)
RBC: 4.35 MIL/uL (ref 3.87–5.11)
RDW: 15.2 % (ref 11.5–15.5)
WBC: 11.1 10*3/uL — AB (ref 4.0–10.5)

## 2015-12-27 LAB — BASIC METABOLIC PANEL
Anion gap: 10 (ref 5–15)
BUN: 10 mg/dL (ref 6–20)
CHLORIDE: 107 mmol/L (ref 101–111)
CO2: 24 mmol/L (ref 22–32)
CREATININE: 0.89 mg/dL (ref 0.44–1.00)
Calcium: 8.3 mg/dL — ABNORMAL LOW (ref 8.9–10.3)
GFR calc Af Amer: >60 mL/min (ref >60)
GFR calc non Af Amer: >60 mL/min (ref >60)
Glucose, Bld: 108 mg/dL — ABNORMAL HIGH (ref 65–99)
POTASSIUM: 3.5 mmol/L (ref 3.5–5.1)
SODIUM: 141 mmol/L (ref 135–145)

## 2015-12-27 LAB — LACTIC ACID, PLASMA: Lactic Acid, Venous: 0.8 mmol/L (ref 0.5–1.9)

## 2015-12-27 LAB — PROCALCITONIN

## 2015-12-27 MED ORDER — KETOROLAC TROMETHAMINE 30 MG/ML IJ SOLN
INTRAMUSCULAR | Status: AC
  Administered 2015-12-27: 02:00:00
  Filled 2015-12-27: qty 1

## 2015-12-27 MED ORDER — CEPHALEXIN 500 MG PO CAPS: 500.0000 mg | ORAL_CAPSULE | Freq: Three times a day (TID) | ORAL | 0 refills | Status: DC

## 2015-12-27 MED ORDER — KETOROLAC TROMETHAMINE 30 MG/ML IJ SOLN
30.0000 mg | Freq: Four times a day (QID) | INTRAMUSCULAR | Status: DC
  Administered 2015-12-27 (×3): 30 mg via INTRAVENOUS
  Filled 2015-12-27 (×2): qty 1

## 2015-12-27 MED ORDER — HYDROCODONE-ACETAMINOPHEN 5-325 MG PO TABS
1.0000 | ORAL_TABLET | ORAL | Status: DC | PRN
  Administered 2015-12-27 (×2): 1 via ORAL
  Filled 2015-12-27: qty 1

## 2015-12-27 MED ORDER — HYDROCODONE-ACETAMINOPHEN 5-325 MG PO TABS
ORAL_TABLET | ORAL | Status: AC
  Administered 2015-12-27: 01:00:00
  Filled 2015-12-27: qty 1

## 2015-12-27 NOTE — Progress Notes (Signed)
Patient discharge teaching given, including activity, diet, follow-up appoints, and medications. Patient verbalized understanding of all discharge instructions. IV access was d/c'd. Vitals are stable. Skin is intact except as charted in most recent assessments. Pt to be escorted out by NT, to be driven home by family.  Jahmel Flannagan, MBA, BSN, RN 
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
## 2015-12-27 NOTE — Discharge Instructions (Signed)

## 2015-12-28 LAB — URINE CULTURE

## 2015-12-29 DIAGNOSIS — R109 Unspecified abdominal pain: Secondary | ICD-10-CM | POA: Diagnosis not present

## 2015-12-30 ENCOUNTER — Emergency Department (HOSPITAL_COMMUNITY)
Admission: EM | Admit: 2015-12-30 | Discharge: 2015-12-30 | Disposition: A | Discharge status: Home / Self Care | Payer: BLUE CROSS/BLUE SHIELD | Attending: Emergency Medicine | Admitting: Emergency Medicine

## 2015-12-30 ENCOUNTER — Emergency Department (HOSPITAL_COMMUNITY): Payer: BLUE CROSS/BLUE SHIELD

## 2015-12-30 ENCOUNTER — Encounter (HOSPITAL_COMMUNITY): Payer: Self-pay | Admitting: Pharmacy Technician

## 2015-12-30 DIAGNOSIS — R109 Unspecified abdominal pain: Secondary | ICD-10-CM

## 2015-12-30 LAB — PREGNANCY, URINE: Preg Test, Ur: NEGATIVE

## 2015-12-30 LAB — URINALYSIS, ROUTINE W REFLEX MICROSCOPIC
Bilirubin Urine: NEGATIVE
GLUCOSE, UA: NEGATIVE mg/dL
HGB URINE DIPSTICK: NEGATIVE
KETONES UR: NEGATIVE mg/dL
NITRITE: NEGATIVE
PROTEIN: 30 mg/dL — AB
Specific Gravity, Urine: 1.016 (ref 1.005–1.030)
pH: 6 (ref 5.0–8.0)

## 2015-12-30 LAB — I-STAT CHEM 8, ED
BUN: 15 mg/dL (ref 6–20)
CREATININE: 0.9 mg/dL (ref 0.44–1.00)
Calcium, Ion: 1.13 mmol/L — ABNORMAL LOW (ref 1.15–1.40)
Chloride: 98 mmol/L — ABNORMAL LOW (ref 101–111)
GLUCOSE: 98 mg/dL (ref 65–99)
HCT: 32 % — ABNORMAL LOW (ref 36.0–46.0)
HEMOGLOBIN: 10.9 g/dL — AB (ref 12.0–15.0)
Potassium: 4 mmol/L (ref 3.5–5.1)
Sodium: 139 mmol/L (ref 135–145)
TCO2: 31 mmol/L (ref 0–100)

## 2015-12-30 MED ORDER — ONDANSETRON HCL 4 MG/2ML IJ SOLN
4.0000 mg | Freq: Once | INTRAMUSCULAR | Status: AC
  Administered 2015-12-30: 4 mg via INTRAVENOUS
  Filled 2015-12-30: qty 2

## 2015-12-30 MED ORDER — KETOROLAC TROMETHAMINE 30 MG/ML IJ SOLN
30.0000 mg | Freq: Once | INTRAMUSCULAR | Status: DC
  Filled 2015-12-30: qty 1

## 2015-12-30 MED ORDER — SODIUM CHLORIDE 0.9 % IV BOLUS (SEPSIS)
1000.0000 mL | Freq: Once | INTRAVENOUS | Status: AC
  Administered 2015-12-30: 1000 mL via INTRAVENOUS

## 2015-12-30 MED ORDER — MORPHINE SULFATE (PF) 4 MG/ML IV SOLN
4.0000 mg | Freq: Once | INTRAVENOUS | Status: AC
  Administered 2015-12-30: 4 mg via INTRAVENOUS
  Filled 2015-12-30: qty 1

## 2015-12-30 MED ORDER — FENTANYL CITRATE (PF) 100 MCG/2ML IJ SOLN
75.0000 ug | Freq: Once | INTRAMUSCULAR | Status: AC
  Administered 2015-12-30: 75 ug via INTRAVENOUS
  Filled 2015-12-30: qty 2

## 2015-12-30 MED ORDER — HYDROMORPHONE HCL 2 MG/ML IJ SOLN
0.5000 mg | Freq: Once | INTRAMUSCULAR | Status: AC
  Administered 2015-12-30: 0.5 mg via INTRAVENOUS
  Filled 2015-12-30: qty 1

## 2015-12-30 MED ORDER — OXYCODONE-ACETAMINOPHEN 5-325 MG PO TABS: 1.0000 | ORAL_TABLET | Freq: Four times a day (QID) | ORAL | 0 refills | Status: DC | PRN

## 2015-12-30 MED ORDER — ONDANSETRON HCL 4 MG PO TABS: 4.0000 mg | ORAL_TABLET | Freq: Four times a day (QID) | ORAL | 0 refills | Status: DC

## 2015-12-30 NOTE — ED Notes (Signed)
Pt stated that the pain medicine she received has not helped her at all.

## 2015-12-30 NOTE — ED Provider Notes (Signed)
[facility]-EMERGENCY DEPT Provider Note   CSN: [id number] Arrival date & time: 12/29/15  2349  History   Chief Complaint Chief Complaint  Patient presents with  . Flank Pain    HPI Mary Morse is a 35 y.o. female.  HPI   Patient here on 12/2 and diagnosed with Pyelonephritis and non obstructing kidney stones. She stayed in the hospital. Report she was offered to stay one more day but elected to go home. She was feeling mainly better until Tuesday evening when she developed right flank pain and nausea, wraps around her front right abdomen and into her groin. She is due to follow- up with urology regarding her non obstructing stone - hx of lithotripsy. No fever, vomiting, diarrhea, CP or back pain  Past Medical History:  Diagnosis Date  . Anxiety   . Borderline diabetes   . Chronic back pain   . Complication of anesthesia    felt that the first surgery she had like she had a hard time waking up.  . Constipation   . Kidney stone   . Migraine    "2-3 times/month" (12/06/2012)    Patient Active Problem List   Diagnosis Date Noted  . Acute pyelonephritis 12/26/2015  . Migraine 12/26/2015  . Chronic back pain   . Gallstones 11/13/2012  . Renal calculus, right 11/11/2012    Past Surgical History:  Procedure Laterality Date  . ABDOMINAL HYSTERECTOMY  05/2011  . CHOLECYSTECTOMY N/A 12/06/2012   Procedure: LAPAROSCOPIC CHOLECYSTECTOMY WITH INTRAOPERATIVE CHOLANGIOGRAM;  Surgeon: Robyne AskewPaul S Toth III, MD;  [facility];  Service: General;  Laterality: N/A;  . LAPAROSCOPIC CHOLECYSTECTOMY  12/06/2012   w/IOC/notes 12/06/2012  . LITHOTRIPSY  ~ 2010; 10/2012  . REDUCTION MAMMAPLASTY Bilateral 2013  . TUBAL LIGATION  2007    OB History    No data available       Home Medications    Prior to Admission medications   Medication Sig Start Date End Date Taking? Authorizing Provider  cephALEXin (KEFLEX) 500 MG capsule Take 1 capsule (500 mg total) by mouth 3 (three) times daily.  Started 11/29, for 5 days ending 12/3 12/27/15  Yes Maryann Mikhail, DO  Ketorolac Tromethamine (SPRIX) 15.75 MG/SPRAY SOLN Place 1 spray into the nose daily as needed (congestion).   Yes Historical Provider, MD  OXYCONTIN 10 MG 12 hr tablet Take 10 mg by mouth 2 (two) times daily. 12/14/15  Yes Historical Provider, MD  promethazine (PHENERGAN) 25 MG tablet Take 25 mg by mouth every 6 (six) hours as needed for nausea.    Yes Historical Provider, MD  tiZANidine (ZANAFLEX) 2 MG tablet Take 2 mg by mouth every 6 (six) hours as needed for muscle spasms.   Yes Historical Provider, MD  ondansetron (ZOFRAN) 4 MG tablet Take 1 tablet (4 mg total) by mouth every 6 (six) hours. 12/30/15   Marlon Peliffany Jaziel Bennett, PA-C  oxyCODONE-acetaminophen (PERCOCET/ROXICET) 5-325 MG tablet Take 1-2 tablets by mouth every 6 (six) hours as needed. 12/30/15   Marlon Peliffany Gagandeep Pettet, PA-C    Family History Family History  Problem Relation Age of Onset  . Cancer Father     prostate  . Cancer Paternal Grandmother     breast    Social History Social History  Substance Use Topics  . Smoking status: Never Smoker  . Smokeless tobacco: Never Used  . Alcohol use No     Allergies   Patient has no known allergies.   Review of Systems Review of Systems  Review of  Systems All other systems negative except as documented in the HPI. All pertinent positives and negatives as reviewed in the HPI.  Physical Exam Updated Vital Signs BP 100/72   Pulse 77   Temp 98.2 F (36.8 C) (Oral)   Resp 18   Ht 5\' 1"  (1.549 m)   Wt 86.2 kg   SpO2 100%   BMI 35.90 kg/m   Physical Exam Constitutional: She appears well-developed and well-nourished.  HENT:  Head: Normocephalic and atraumatic.  Eyes: Conjunctivae are normal. Pupils are equal, round, and reactive to light.  Neck: Neck supple. No tracheal deviation present. No thyromegaly present.  Cardiovascular: Normal rate and regular rhythm.   No murmur heard. Pulmonary/Chest: Effort normal  and breath sounds normal.  Abdominal: Soft. Bowel sounds are normal. She exhibits no distension. There is no tenderness.  Obese  Genitourinary:  Genitourinary Comments: Mild right flank tenderness  Musculoskeletal: Normal range of motion. She exhibits no edema or tenderness.  Neurological: She is alert. Coordination normal.  Skin: Skin is warm and dry. No rash noted.  Psychiatric: She has a normal mood and affect.  Nursing note and vitals reviewed  ED Treatments / Results  Labs (all labs ordered are listed, but only abnormal results are displayed) Labs Reviewed  URINALYSIS, ROUTINE W REFLEX MICROSCOPIC - Abnormal; Notable for the following:       Result Value   Protein, ur 30 (*)    Leukocytes, UA TRACE (*)    Bacteria, UA RARE (*)    Squamous Epithelial / LPF 0-5 (*)    All other components within normal limits  I-STAT CHEM 8, ED - Abnormal; Notable for the following:    Chloride 98 (*)    Calcium, Ion 1.13 (*)    Hemoglobin 10.9 (*)    HCT 32.0 (*)    All other components within normal limits  PREGNANCY, URINE    EKG  EKG Interpretation None       Radiology Ct Renal Stone Study  Result Date: 12/30/2015 CLINICAL DATA:  Flank pain and nausea EXAM: CT ABDOMEN AND PELVIS WITHOUT CONTRAST TECHNIQUE: Multidetector CT imaging of the abdomen and pelvis was performed following the standard protocol without IV contrast. COMPARISON:  12/26/2015 FINDINGS: Lower chest: No acute abnormality. Hepatobiliary: No focal liver abnormality is seen. Status post cholecystectomy. No biliary dilatation. Pancreas: Unremarkable. No pancreatic ductal dilatation or surrounding inflammatory changes. Spleen: Normal in size without focal abnormality. Adrenals/Urinary Tract: Both adrenals are normal. There are lower pole right renal collecting system calculi measuring 5 mm and 7 mm. No left-sided calculi. No ureteral calculi. There is moderate right pelvicaliectasis and right ureterectasis, as well as a  mildly edematous appearance to the right renal parenchyma. This could represent pyelonephritis. No perinephric collection. Left kidney and ureter are normal. Urinary bladder is unremarkable. Stomach/Bowel: Stomach is within normal limits. Appendix is normal. No evidence of bowel wall thickening, distention, or inflammatory changes. Vascular/Lymphatic: No significant vascular findings are present. No enlarged abdominal or pelvic lymph nodes. Reproductive: Status post hysterectomy. No adnexal masses. Other: No ascites.  Small fat containing umbilical hernia Musculoskeletal: No significant skeletal lesions IMPRESSION: Unchanged lower pole right nephrolithiasis. Mildly edematous appearance of the right kidney with moderate pelvicaliectasis. Cannot exclude right pyelonephritis. No perinephric collection. Electronically Signed   By: Ellery Plunk M.D.   On: 12/30/2015 05:24    Procedures Procedures (including critical care time)  Medications Ordered in ED Medications  sodium chloride 0.9 % bolus 1,000 mL (1,000 mLs Intravenous New  Bag/Given 12/30/15 0436)  ondansetron (ZOFRAN) injection 4 mg (4 mg Intravenous Given 12/30/15 0437)  morphine 4 MG/ML injection 4 mg (4 mg Intravenous Given 12/30/15 0514)  fentaNYL (SUBLIMAZE) injection 75 mcg (75 mcg Intravenous Given 12/30/15 0610)  HYDROmorphone (DILAUDID) injection 0.5 mg (0.5 mg Intravenous Given 12/30/15 0645)   Initial Impression / Assessment and Plan / ED Course  I have reviewed the triage vital signs and the nursing notes.  Pertinent labs & imaging results that were available during my care of the patient were reviewed by me and considered in my medical decision making (see chart for details).  Clinical Course     Pts urinalysis shows infection has cleared Chem 8 is unremarkable. Will repeat CT renal study to further evaluate recurrent acute onset of right flank pain.  -- reviewed findings of urine, chem and ct renal with Urology, Dr. Berneice HeinrichManny  says that she has very very mild hydronephrosis and there is no reason for admission.  She can continue the keflex. Will write her for a small rx for Percocet. I do have some concern that the patient said Fentanyl , Percocet and Morphine do nothing whatsoever to help with her pain and is specifically requesting Dilaudid. She is afebrile and not vomiting. Well appearing.  F/u with urology. Return precautions discussed.  Final Clinical Impressions(s) / ED Diagnoses   Final diagnoses:  Left flank pain    New Prescriptions New Prescriptions   ONDANSETRON (ZOFRAN) 4 MG TABLET    Take 1 tablet (4 mg total) by mouth every 6 (six) hours.   OXYCODONE-ACETAMINOPHEN (PERCOCET/ROXICET) 5-325 MG TABLET    Take 1-2 tablets by mouth every 6 (six) hours as needed.     Marlon Peliffany Torin Whisner, PA-C 12/30/15 [id number]    Shon Batonourtney F Horton, MD 01/03/16 [id number]

## 2015-12-30 NOTE — Discharge Instructions (Signed)
Continue antibiotics until complete

## 2015-12-30 NOTE — ED Triage Notes (Signed)
Pt presents to the ed with complaints of flank pain and nausea. Pt dx with a kidney stone Saturday and discharged home the next day with antibiotics and pain medications. Pt reports she was instructed to follow up with a urologist because the stone is too big for her to pass. Pt is here today due to the pain and nausea.

## 2015-12-31 LAB — CULTURE, BLOOD (ROUTINE X 2)
CULTURE: NO GROWTH
Culture: NO GROWTH

## 2016-01-07 ENCOUNTER — Emergency Department (HOSPITAL_COMMUNITY): Payer: BLUE CROSS/BLUE SHIELD

## 2016-01-07 ENCOUNTER — Inpatient Hospital Stay (HOSPITAL_COMMUNITY)
Admission: EM | Admit: 2016-01-07 | Discharge: 2016-01-09 | DRG: 690 | Disposition: A | Discharge status: Home / Self Care | Payer: BLUE CROSS/BLUE SHIELD | Attending: Internal Medicine | Admitting: Internal Medicine

## 2016-01-07 ENCOUNTER — Encounter (HOSPITAL_COMMUNITY): Payer: Self-pay | Admitting: *Deleted

## 2016-01-07 DIAGNOSIS — N10 Acute tubulo-interstitial nephritis: Principal | ICD-10-CM | POA: Diagnosis present

## 2016-01-07 DIAGNOSIS — Z560 Unemployment, unspecified: Secondary | ICD-10-CM

## 2016-01-07 DIAGNOSIS — G43909 Migraine, unspecified, not intractable, without status migrainosus: Secondary | ICD-10-CM | POA: Diagnosis present

## 2016-01-07 DIAGNOSIS — Z87442 Personal history of urinary calculi: Secondary | ICD-10-CM | POA: Insufficient documentation

## 2016-01-07 DIAGNOSIS — Z809 Family history of malignant neoplasm, unspecified: Secondary | ICD-10-CM

## 2016-01-07 DIAGNOSIS — M549 Dorsalgia, unspecified: Secondary | ICD-10-CM

## 2016-01-07 DIAGNOSIS — E876 Hypokalemia: Secondary | ICD-10-CM | POA: Diagnosis not present

## 2016-01-07 DIAGNOSIS — Z6836 Body mass index (BMI) 36.0-36.9, adult: Secondary | ICD-10-CM

## 2016-01-07 DIAGNOSIS — Z9049 Acquired absence of other specified parts of digestive tract: Secondary | ICD-10-CM

## 2016-01-07 DIAGNOSIS — N12 Tubulo-interstitial nephritis, not specified as acute or chronic: Secondary | ICD-10-CM | POA: Diagnosis not present

## 2016-01-07 DIAGNOSIS — Z765 Malingerer [conscious simulation]: Secondary | ICD-10-CM

## 2016-01-07 DIAGNOSIS — Z79899 Other long term (current) drug therapy: Secondary | ICD-10-CM

## 2016-01-07 DIAGNOSIS — D649 Anemia, unspecified: Secondary | ICD-10-CM | POA: Diagnosis present

## 2016-01-07 DIAGNOSIS — Z9851 Tubal ligation status: Secondary | ICD-10-CM

## 2016-01-07 DIAGNOSIS — Z90710 Acquired absence of both cervix and uterus: Secondary | ICD-10-CM

## 2016-01-07 DIAGNOSIS — G8929 Other chronic pain: Secondary | ICD-10-CM | POA: Diagnosis present

## 2016-01-07 DIAGNOSIS — E669 Obesity, unspecified: Secondary | ICD-10-CM | POA: Diagnosis present

## 2016-01-07 HISTORY — DX: Acute pyelonephritis: N10

## 2016-01-07 HISTORY — DX: Personal history of urinary calculi: Z87.442

## 2016-01-07 HISTORY — DX: Gastro-esophageal reflux disease without esophagitis: K21.9

## 2016-01-07 LAB — BASIC METABOLIC PANEL
ANION GAP: 11 (ref 5–15)
BUN: 14 mg/dL (ref 6–20)
CALCIUM: 9 mg/dL (ref 8.9–10.3)
CO2: 27 mmol/L (ref 22–32)
Chloride: 100 mmol/L — ABNORMAL LOW (ref 101–111)
Creatinine, Ser: 1.01 mg/dL — ABNORMAL HIGH (ref 0.44–1.00)
GFR calc Af Amer: >60 mL/min (ref >60)
GFR calc non Af Amer: >60 mL/min (ref >60)
GLUCOSE: 111 mg/dL — AB (ref 65–99)
POTASSIUM: 3.3 mmol/L — AB (ref 3.5–5.1)
Sodium: 138 mmol/L (ref 135–145)

## 2016-01-07 LAB — CBC
HEMATOCRIT: 38.3 % (ref 36.0–46.0)
HEMOGLOBIN: 12.3 g/dL (ref 12.0–15.0)
MCH: 25.2 pg — AB (ref 26.0–34.0)
MCHC: 32.1 g/dL (ref 30.0–36.0)
MCV: 78.3 fL (ref 78.0–100.0)
Platelets: 194 10*3/uL (ref 150–400)
RBC: 4.89 MIL/uL (ref 3.87–5.11)
RDW: 14.9 % (ref 11.5–15.5)
WBC: 15.6 10*3/uL — ABNORMAL HIGH (ref 4.0–10.5)

## 2016-01-07 LAB — URINALYSIS, ROUTINE W REFLEX MICROSCOPIC
Bilirubin Urine: NEGATIVE
GLUCOSE, UA: NEGATIVE mg/dL
KETONES UR: NEGATIVE mg/dL
Nitrite: NEGATIVE
PH: 6 (ref 5.0–8.0)
Protein, ur: >=300 mg/dL — AB
Specific Gravity, Urine: 1.016 (ref 1.005–1.030)

## 2016-01-07 LAB — I-STAT BETA HCG BLOOD, ED (MC, WL, AP ONLY): I-stat hCG, quantitative: <5 m[IU]/mL (ref <5)

## 2016-01-07 LAB — I-STAT CG4 LACTIC ACID, ED: Lactic Acid, Venous: 0.79 mmol/L (ref 0.5–1.9)

## 2016-01-07 MED ORDER — SODIUM CHLORIDE 0.9 % IV BOLUS (SEPSIS)
1000.0000 mL | Freq: Once | INTRAVENOUS | Status: AC
  Administered 2016-01-07: 1000 mL via INTRAVENOUS

## 2016-01-07 MED ORDER — ACETAMINOPHEN 325 MG PO TABS: 650.0000 mg | ORAL_TABLET | Freq: Four times a day (QID) | ORAL | Status: DC | PRN

## 2016-01-07 MED ORDER — TIZANIDINE HCL 4 MG PO TABS
2.0000 mg | ORAL_TABLET | Freq: Four times a day (QID) | ORAL | Status: DC | PRN
  Administered 2016-01-07: 2 mg via ORAL
  Filled 2016-01-07 (×2): qty 1

## 2016-01-07 MED ORDER — POTASSIUM CHLORIDE CRYS ER 20 MEQ PO TBCR
20.0000 meq | EXTENDED_RELEASE_TABLET | Freq: Two times a day (BID) | ORAL | Status: AC
  Administered 2016-01-07 (×2): 20 meq via ORAL
  Filled 2016-01-07 (×2): qty 1

## 2016-01-07 MED ORDER — PROMETHAZINE HCL 25 MG/ML IJ SOLN
25.0000 mg | Freq: Once | INTRAMUSCULAR | Status: AC
  Administered 2016-01-07: 25 mg via INTRAVENOUS
  Filled 2016-01-07: qty 1

## 2016-01-07 MED ORDER — SODIUM CHLORIDE 0.9 % IV SOLN
INTRAVENOUS | Status: DC
  Administered 2016-01-07 – 2016-01-08 (×3): via INTRAVENOUS

## 2016-01-07 MED ORDER — DEXTROSE 5 % IV SOLN
1.0000 g | Freq: Once | INTRAVENOUS | Status: AC
  Administered 2016-01-07: 1 g via INTRAVENOUS
  Filled 2016-01-07: qty 10

## 2016-01-07 MED ORDER — ONDANSETRON HCL 4 MG/2ML IJ SOLN
4.0000 mg | Freq: Once | INTRAMUSCULAR | Status: AC
  Administered 2016-01-07: 4 mg via INTRAVENOUS
  Filled 2016-01-07: qty 2

## 2016-01-07 MED ORDER — FLUCONAZOLE 100 MG PO TABS
150.0000 mg | ORAL_TABLET | Freq: Every day | ORAL | Status: AC
  Administered 2016-01-07 – 2016-01-09 (×3): 150 mg via ORAL
  Filled 2016-01-07 (×3): qty 2

## 2016-01-07 MED ORDER — ENOXAPARIN SODIUM 40 MG/0.4ML ~~LOC~~ SOLN
40.0000 mg | SUBCUTANEOUS | Status: DC
  Filled 2016-01-07 (×2): qty 0.4

## 2016-01-07 MED ORDER — SODIUM CHLORIDE 0.9 % IV BOLUS (SEPSIS)
500.0000 mL | Freq: Once | INTRAVENOUS | Status: AC
  Administered 2016-01-07: 500 mL via INTRAVENOUS

## 2016-01-07 MED ORDER — ACETAMINOPHEN 650 MG RE SUPP: 650.0000 mg | Freq: Four times a day (QID) | RECTAL | Status: DC | PRN

## 2016-01-07 MED ORDER — FENTANYL CITRATE (PF) 100 MCG/2ML IJ SOLN
100.0000 ug | Freq: Once | INTRAMUSCULAR | Status: AC
  Administered 2016-01-07: 100 ug via INTRAVENOUS
  Filled 2016-01-07: qty 2

## 2016-01-07 MED ORDER — OXYCODONE HCL ER 10 MG PO T12A
10.0000 mg | EXTENDED_RELEASE_TABLET | Freq: Two times a day (BID) | ORAL | Status: DC
  Administered 2016-01-07 – 2016-01-09 (×4): 10 mg via ORAL
  Filled 2016-01-07 (×5): qty 1

## 2016-01-07 MED ORDER — ONDANSETRON HCL 4 MG PO TABS
4.0000 mg | ORAL_TABLET | Freq: Four times a day (QID) | ORAL | Status: DC | PRN
  Administered 2016-01-09: 4 mg via ORAL
  Filled 2016-01-07: qty 1

## 2016-01-07 MED ORDER — HYDROMORPHONE HCL 2 MG/ML IJ SOLN
0.5000 mg | INTRAMUSCULAR | Status: DC | PRN
  Administered 2016-01-07 – 2016-01-08 (×7): 0.5 mg via INTRAVENOUS
  Filled 2016-01-07 (×8): qty 1

## 2016-01-07 MED ORDER — SODIUM CHLORIDE 0.9 % IV BOLUS (SEPSIS)
500.0000 mL | Freq: Once | INTRAVENOUS | Status: AC | PRN
  Administered 2016-01-08: 500 mL via INTRAVENOUS

## 2016-01-07 MED ORDER — PROMETHAZINE HCL 25 MG PO TABS
25.0000 mg | ORAL_TABLET | Freq: Four times a day (QID) | ORAL | Status: DC | PRN
Start: 2016-01-07 — End: 2016-01-09
  Administered 2016-01-07 – 2016-01-08 (×2): 25 mg via ORAL
  Filled 2016-01-07 (×2): qty 1

## 2016-01-07 MED ORDER — KETOROLAC TROMETHAMINE 15.75 MG/SPRAY NA SOLN: 1.0000 | Freq: Every day | NASAL | Status: DC | PRN

## 2016-01-07 MED ORDER — DEXTROSE 5 % IV SOLN
1.0000 g | INTRAVENOUS | Status: DC
  Administered 2016-01-08: 1 g via INTRAVENOUS
  Filled 2016-01-07 (×2): qty 10

## 2016-01-07 MED ORDER — OXYCODONE-ACETAMINOPHEN 5-325 MG PO TABS
1.0000 | ORAL_TABLET | ORAL | Status: DC | PRN
  Administered 2016-01-07: 1 via ORAL

## 2016-01-07 MED ORDER — PHENAZOPYRIDINE HCL 100 MG PO TABS
200.0000 mg | ORAL_TABLET | Freq: Three times a day (TID) | ORAL | Status: DC
  Administered 2016-01-07 – 2016-01-09 (×6): 200 mg via ORAL
  Filled 2016-01-07 (×6): qty 2

## 2016-01-07 MED ORDER — MORPHINE SULFATE (PF) 4 MG/ML IV SOLN
4.0000 mg | Freq: Once | INTRAVENOUS | Status: AC
  Administered 2016-01-07: 4 mg via INTRAVENOUS
  Filled 2016-01-07: qty 1

## 2016-01-07 MED ORDER — ONDANSETRON HCL 4 MG/2ML IJ SOLN
4.0000 mg | Freq: Four times a day (QID) | INTRAMUSCULAR | Status: DC | PRN
  Administered 2016-01-07: 4 mg via INTRAVENOUS
  Filled 2016-01-07: qty 2

## 2016-01-07 MED ORDER — OXYCODONE-ACETAMINOPHEN 5-325 MG PO TABS
ORAL_TABLET | ORAL | Status: AC
  Filled 2016-01-07: qty 1

## 2016-01-07 NOTE — H&P (Signed)
History and Physical    Mary Morse WUJ:[id number] DOB: [birth year] DOA: 01/07/2016  PCP: No PCP Per Patient Patient coming from: home  Chief Complaint: right flank pain  HPI: Mary Morse is a 35 y.o. female with medical history significant for migraines, chronic back pain, anxiety and recent hospitalization for pyelonephritis presents to the emergency Department chief complaint acute right flank pain. Initial evaluation reveals pyelonephritis  Information is obtained from the patient. She reports yesterday developing sudden acute right flank pain. She was discharged from the hospital 10 days ago after 1 day's stay for same. She reports completing her antibiotics. She states she did well until yesterday when she developed "the same kind of pain as before but only worse". She reports dysuria frequency. Other associated symptoms include nausea without vomiting. She denies headache dizziness syncope or near-syncope. She reports taking her oxycodone with little relief. She admits to not having a PCP and not making a follow-up appointment upon her discharge. She did have a urologist 3 years ago but currently does not have an appointment.  ED Course: In the emergency department she's afebrile hemodynamically stable and not hypoxic. She is given fentanyl and Zofran.  Review of Systems: As per HPI otherwise 10 point review of systems negative.   Ambulatory Status: Independent with ADLs  Past Medical History:  Diagnosis Date  . Anxiety   . Borderline diabetes   . Chronic back pain   . Complication of anesthesia    felt that the first surgery she had like she had a hard time waking up.  . Constipation   . Kidney stone   . Migraine    "2-3 times/month" (12/06/2012)    Past Surgical History:  Procedure Laterality Date  . ABDOMINAL HYSTERECTOMY  05/2011  . CHOLECYSTECTOMY N/A 12/06/2012   Procedure: LAPAROSCOPIC CHOLECYSTECTOMY WITH INTRAOPERATIVE CHOLANGIOGRAM;  Surgeon: Robyne Askew, MD;   Location: [facility] OR;  Service: General;  Laterality: N/A;  . LAPAROSCOPIC CHOLECYSTECTOMY  12/06/2012   w/IOC/notes 12/06/2012  . LITHOTRIPSY  ~ 2010; 10/2012  . REDUCTION MAMMAPLASTY Bilateral 2013  . TUBAL LIGATION  2007    Social History   Social History  . Marital status: Single    Spouse name: N/A  . Number of children: N/A  . Years of education: N/A   Occupational History  . Not on file.   Social History Main Topics  . Smoking status: Never Smoker  . Smokeless tobacco: Never Used  . Alcohol use No  . Drug use: No  . Sexual activity: Yes   Other Topics Concern  . Not on file   Social History Narrative  . No narrative on file   She lives with his significant other is unemployed No Known Allergies  Family History  Problem Relation Age of Onset  . Cancer Father     prostate  . Cancer Paternal Grandmother     breast    Prior to Admission medications   Medication Sig Start Date End Date Taking? Authorizing Provider  Ketorolac Tromethamine (SPRIX) 15.75 MG/SPRAY SOLN Place 1 spray into the nose daily as needed (congestion).   Yes Historical Provider, MD  oxyCODONE-acetaminophen (PERCOCET/ROXICET) 5-325 MG tablet Take 1-2 tablets by mouth every 6 (six) hours as needed. 12/30/15  Yes Tiffany Neva Seat, PA-C  OXYCONTIN 10 MG 12 hr tablet Take 10 mg by mouth 2 (two) times daily. 12/14/15  Yes Historical Provider, MD  promethazine (PHENERGAN) 25 MG tablet Take 25 mg by mouth every 6 (six) hours as needed  for nausea.    Yes Historical Provider, MD  tiZANidine (ZANAFLEX) 2 MG tablet Take 2 mg by mouth every 6 (six) hours as needed for muscle spasms.   Yes Historical Provider, MD  cephALEXin (KEFLEX) 500 MG capsule Take 1 capsule (500 mg total) by mouth 3 (three) times daily. Started 11/29, for 5 days ending 12/3 Patient not taking: Reported on 01/07/2016 12/27/15   Nita Sells Mikhail, DO  ondansetron (ZOFRAN) 4 MG tablet Take 1 tablet (4 mg total) by mouth every 6 (six)  hours. Patient not taking: Reported on 01/07/2016 12/30/15   Marlon Pel, PA-C    Physical Exam: Vitals:   01/07/16 0541 01/07/16 0600 01/07/16 0730 01/07/16 0830  BP: 94/63 91/62 92/68  93/64  Pulse: 88 84 77 74  Resp: 16  15   Temp:      TempSrc:      SpO2: 97% 100% 100% 100%  Weight:      Height:         General:  Appears calm And even slightly lethargic and certainly not uncomfortable Eyes:  PERRL, EOMI, normal lids, iris ENT:  grossly normal hearing, lips & tongue, his membranes of her mouth are moist and pink Neck:  no LAD, masses or thyromegaly Cardiovascular:  RRR, no m/r/g. No LE edema.  Respiratory:  CTA bilaterally, no w/r/r. Normal respiratory effort. Abdomen:  soft, ntnd, obese positive bowel sounds, no tenderness and right flank or CVA Skin:  no rash or induration seen on limited exam Musculoskeletal:  grossly normal tone BUE/BLE, good ROM, no bony abnormality Psychiatric:  grossly normal mood and affect, speech fluent and appropriate, AOx3 Neurologic:  CN 2-12 grossly intact, moves all extremities in coordinated fashion, sensation intact  Labs on Admission: I have personally reviewed following labs and imaging studies  CBC:  Recent Labs Lab 01/07/16 0237  WBC 15.6*  HGB 12.3  HCT 38.3  MCV 78.3  PLT 194   Basic Metabolic Panel:  Recent Labs Lab 01/07/16 0237  NA 138  K 3.3*  CL 100*  CO2 27  GLUCOSE 111*  BUN 14  CREATININE 1.01*  CALCIUM 9.0   GFR: Estimated Creatinine Clearance: 77.9 mL/min (by C-G formula based on SCr of 1.01 mg/dL (H)). Liver Function Tests: No results for input(s): AST, ALT, ALKPHOS, BILITOT, PROT, ALBUMIN in the last 168 hours. No results for input(s): LIPASE, AMYLASE in the last 168 hours. No results for input(s): AMMONIA in the last 168 hours. Coagulation Profile: No results for input(s): INR, PROTIME in the last 168 hours. Cardiac Enzymes: No results for input(s): CKTOTAL, CKMB, CKMBINDEX, TROPONINI in the  last 168 hours. BNP (last 3 results) No results for input(s): PROBNP in the last 8760 hours. HbA1C: No results for input(s): HGBA1C in the last 72 hours. CBG: No results for input(s): GLUCAP in the last 168 hours. Lipid Profile: No results for input(s): CHOL, HDL, LDLCALC, TRIG, CHOLHDL, LDLDIRECT in the last 72 hours. Thyroid Function Tests: No results for input(s): TSH, T4TOTAL, FREET4, T3FREE, THYROIDAB in the last 72 hours. Anemia Panel: No results for input(s): VITAMINB12, FOLATE, FERRITIN, TIBC, IRON, RETICCTPCT in the last 72 hours. Urine analysis:    Component Value Date/Time   COLORURINE YELLOW 01/07/2016 0249   APPEARANCEUR TURBID (A) 01/07/2016 0249   LABSPEC 1.016 01/07/2016 0249   PHURINE 6.0 01/07/2016 0249   GLUCOSEU NEGATIVE 01/07/2016 0249   HGBUR MODERATE (A) 01/07/2016 0249   BILIRUBINUR NEGATIVE 01/07/2016 0249   KETONESUR NEGATIVE 01/07/2016 0249   PROTEINUR >=300 (  A) 01/07/2016 0249   UROBILINOGEN 0.2 11/09/2012 1919   NITRITE NEGATIVE 01/07/2016 0249   LEUKOCYTESUR LARGE (A) 01/07/2016 0249    Creatinine Clearance: Estimated Creatinine Clearance: 77.9 mL/min (by C-G formula based on SCr of 1.01 mg/dL (H)).  Sepsis Labs: @LABRCNTIP (procalcitonin:4,lacticidven:4) )No results found for this or any previous visit (from the past 240 hour(s)).   Radiological Exams on Admission: [facility]  Result Date: 01/07/2016 CLINICAL DATA:  35 year old female with right flank pain radiating to the groin. EXAM: RENAL / URINARY TRACT ULTRASOUND COMPLETE COMPARISON:  Abdominal CT dated 12/30/2015 FINDINGS: Right Kidney: Length: 11 cm. Echogenicity within normal limits. There is an 8 mm nonobstructing stone in the interpolar aspect of the right kidney. No hydronephrosis. A 1.5 x 1.4 x 2.0 cm cyst is noted in the upper pole of the right kidney. Left Kidney: Length: 9 cm. Echogenicity within normal limits. No mass or hydronephrosis visualized. Bladder: Appears normal for degree  of bladder distention. IMPRESSION: An 8 mm nonobstructing right renal interpolar stone. No hydronephrosis. A 2 cm right renal upper pole cyst. Electronically Signed   By: Elgie CollardArash  Radparvar M.D.   On: 01/07/2016 04:51   Ct Renal Stone Study  Result Date: 01/07/2016 CLINICAL DATA:  Right flank pain EXAM: CT ABDOMEN AND PELVIS WITHOUT CONTRAST TECHNIQUE: Multidetector CT imaging of the abdomen and pelvis was performed following the standard protocol without IV contrast. COMPARISON:  CT abdomen pelvis 12/30/2015 FINDINGS: Lower chest: Lung bases clearing Hepatobiliary: Cholecystectomy clips.  Liver and bile ducts normal. Pancreas: Negative Spleen: Negative Adrenals/Urinary Tract: Nonobstructing right lower pole renal calculi unchanged. Largest stone is 5 x 7 mm. Smaller stone 4 mm. Mild dilatation right renal pelvis with thickening of the renal pelvis and ureter. No obstructing ureteral stone. Diffuse low-density throughout the renal pyramids with mild swelling of the right kidney. Probable pyelonephritis. No left renal calculi. Left kidney is normal. Bladder is normal. No bladder calculi. Stomach/Bowel: Stomach and duodenum normal. Negative for bowel obstruction. Normal appendix. Vascular/Lymphatic: Negative Reproductive: Hysterectomy.  No pelvic mass Other: No free fluid or abscess. Musculoskeletal: Negative IMPRESSION: Small nonobstructing right lower pole renal calculi unchanged. Mild dilatation and thickening of the right pelvis and right ureter without obstructing stone. Low-density edema in the right renal pyramids with mild swelling of the right kidney. Findings are suggestive of pyelonephritis without abscess. Electronically Signed   By: Marlan Palauharles  Clark M.D.   On: 01/07/2016 06:50    EKG:   Assessment/Plan Principal Problem:   Acute pyelonephritis Active Problems:   Chronic back pain   Hypokalemia   Obesity   #1. Acute pyelonephritis. She was discharged 10 days ago after 1 day hospitalization  for same. She reports completing antibiotics. It appears she was discharged with 5-6 days of antibiotics. CT of the abdomen on nonobstructing renal calculi unchanged with mild dilatation and thickening of the right pelvis and right ureter without obstructing stone. Mild swelling of the right kidney suggestive of pyelonephritis without abscess. Dr Margot AblesMerrill discussed case with Dr. Mena GoesEskridge reviewed imaging and states stone has been there several years and is unchanged. Also opines likely deep seeded infection not resolved from previous treatment. Recommends 14 days of antibiotics and follow-up with Dr. Eugenie FillerHermick with urology. Previous urine culture Escherichia coli pansensitive -Admit -Continue Rocephin -Follow urine culture -Pain management. Patient is partial to her IV pain medicines  #2. Hypokalemia. Mild. -Replete -Recheck in the a.m.  3. Chronic back pain. Appears stable at baseline. Home medications include Percocet, OxyContin -Continue home meds -  Monitor  #4. Obesity. BMI 36.4. -Nutritional consult  DVT prophylaxis: lovenox  Code Status: full  Family Communication: none present  Disposition Plan: home  Consults called: eskridge  Admission status: obs    Toya SmothersBLACK,Kennedey Digilio M MD [facility]  If 7PM-7AM, please contact night-coverage [email or web address] Password [email or web address]  01/07/2016, 9:08 AM

## 2016-01-07 NOTE — ED Triage Notes (Addendum)
Pt c/o R flank pain radiating into R groin with pressure when urinating. Pt reports recent admisson for kidney infection and stones

## 2016-01-07 NOTE — ED Notes (Signed)
Pt reports fentanyl did not help last time and is requesting dilaudid. Provider made aware and will come speak with the pt concerning narcotics

## 2016-01-07 NOTE — ED Notes (Signed)
Pt taken to US

## 2016-01-07 NOTE — ED Notes (Signed)
istat [facility]  Less than 5.0

## 2016-01-07 NOTE — ED Provider Notes (Signed)
[facility]-EMERGENCY DEPT Provider Note   CSN: [id number] Arrival date & time: 01/07/16  0227     History   Chief Complaint Chief Complaint  Patient presents with  . Flank Pain    HPI Nori RiisDana Trindade is a 35 y.o. female.  Nori RiisDana Michelsen is a 35 y.o. Female who presents to the emergency department complaining of 2 days of dysuria and constant right flank pain. Patient reports she's been having intermittent right flank pain for the past 2-3 weeks. She was admitted to the hospital earlier this month for pyelonephritis. She tells me that this improved and then 2 days ago her flank pain returned and is very constant. She reports she also began having dysuria starting yesterday. She reports taking her oxycodone at home with little relief. She is due to follow-up with a urologist next week. Patient reports her oxycodone is not helping. Patient denies fevers, vomiting, diarrhea, hematuria, lightheadedness, cough, syncope, vaginal bleeding, vaginal discharge or rashes.   The history is provided by the patient and medical records. No language interpreter was used.  Flank Pain  Pertinent negatives include no chest pain, no abdominal pain, no headaches and no shortness of breath.    Past Medical History:  Diagnosis Date  . Anxiety   . Borderline diabetes   . Chronic back pain   . Complication of anesthesia    felt that the first surgery she had like she had a hard time waking up.  . Constipation   . Kidney stone   . Migraine    "2-3 times/month" (12/06/2012)    Patient Active Problem List   Diagnosis Date Noted  . Hypokalemia 01/07/2016  . Acute pyelonephritis 12/26/2015  . Migraine 12/26/2015  . Chronic back pain   . Gallstones 11/13/2012  . Renal calculus, right 11/11/2012    Past Surgical History:  Procedure Laterality Date  . ABDOMINAL HYSTERECTOMY  05/2011  . CHOLECYSTECTOMY N/A 12/06/2012   Procedure: LAPAROSCOPIC CHOLECYSTECTOMY WITH INTRAOPERATIVE CHOLANGIOGRAM;  Surgeon: Robyne AskewPaul S  Toth III, MD;  [facility];  Service: General;  Laterality: N/A;  . LAPAROSCOPIC CHOLECYSTECTOMY  12/06/2012   w/IOC/notes 12/06/2012  . LITHOTRIPSY  ~ 2010; 10/2012  . REDUCTION MAMMAPLASTY Bilateral 2013  . TUBAL LIGATION  2007    OB History    No data available       Home Medications    Prior to Admission medications   Medication Sig Start Date End Date Taking? Authorizing Provider  Ketorolac Tromethamine (SPRIX) 15.75 MG/SPRAY SOLN Place 1 spray into the nose daily as needed (congestion).   Yes Historical Provider, MD  oxyCODONE-acetaminophen (PERCOCET/ROXICET) 5-325 MG tablet Take 1-2 tablets by mouth every 6 (six) hours as needed. 12/30/15  Yes Tiffany Neva SeatGreene, PA-C  OXYCONTIN 10 MG 12 hr tablet Take 10 mg by mouth 2 (two) times daily. 12/14/15  Yes Historical Provider, MD  promethazine (PHENERGAN) 25 MG tablet Take 25 mg by mouth every 6 (six) hours as needed for nausea.    Yes Historical Provider, MD  tiZANidine (ZANAFLEX) 2 MG tablet Take 2 mg by mouth every 6 (six) hours as needed for muscle spasms.   Yes Historical Provider, MD  cephALEXin (KEFLEX) 500 MG capsule Take 1 capsule (500 mg total) by mouth 3 (three) times daily. Started 11/29, for 5 days ending 12/3 Patient not taking: Reported on 01/07/2016 12/27/15   Nita SellsMaryann Mikhail, DO  ondansetron (ZOFRAN) 4 MG tablet Take 1 tablet (4 mg total) by mouth every 6 (six) hours. Patient not taking: Reported  on 01/07/2016 12/30/15   Marlon Pel, PA-C    Family History Family History  Problem Relation Age of Onset  . Cancer Father     prostate  . Cancer Paternal Grandmother     breast    Social History Social History  Substance Use Topics  . Smoking status: Never Smoker  . Smokeless tobacco: Never Used  . Alcohol use No     Allergies   Patient has no known allergies.   Review of Systems Review of Systems  Constitutional: Negative for chills and fever.  HENT: Negative for congestion and sore throat.   Eyes:  Negative for visual disturbance.  Respiratory: Negative for cough and shortness of breath.   Cardiovascular: Negative for chest pain.  Gastrointestinal: Positive for nausea. Negative for abdominal pain, diarrhea and vomiting.  Genitourinary: Positive for dysuria, flank pain and frequency. Negative for difficulty urinating, hematuria, urgency, vaginal bleeding and vaginal discharge.  Musculoskeletal: Negative for back pain.  Skin: Negative for rash.  Neurological: Negative for light-headedness and headaches.     Physical Exam Updated Vital Signs BP 92/68   Pulse 77   Temp 98.4 F (36.9 C) (Oral)   Resp 15   Ht 5\' 1"  (1.549 m)   Wt 88.5 kg   SpO2 100%   BMI 36.84 kg/m   Physical Exam  Constitutional: She appears well-developed and well-nourished. No distress.  Nontoxic appearing. Patient appears comfortable lying in the bed.  HENT:  Head: Normocephalic and atraumatic.  Mouth/Throat: Oropharynx is clear and moist.  Eyes: Conjunctivae are normal. Pupils are equal, round, and reactive to light. Right eye exhibits no discharge. Left eye exhibits no discharge.  Neck: Neck supple.  Cardiovascular: Normal rate, regular rhythm, normal heart sounds and intact distal pulses.  Exam reveals no gallop and no friction rub.   No murmur heard. Heart rate is 96.  Pulmonary/Chest: Effort normal and breath sounds normal. No respiratory distress. She has no wheezes. She has no rales.  Lungs are clear to auscultation bilaterally.  Abdominal: Soft. Bowel sounds are normal. She exhibits no distension and no mass. There is no tenderness. There is no rebound and no guarding.  Abdomen is soft and nontender to palpation. No CVA or flank tenderness. Patient is very distractible. I palpated on her back without any complaint of pain.  Musculoskeletal: She exhibits no edema.  Lymphadenopathy:    She has no cervical adenopathy.  Neurological: She is alert. Coordination normal.  Skin: Skin is warm and dry.  No rash noted. She is not diaphoretic. No erythema. No pallor.  Psychiatric: She has a normal mood and affect. Her behavior is normal.  Nursing note and vitals reviewed.    ED Treatments / Results  Labs (all labs ordered are listed, but only abnormal results are displayed) Labs Reviewed  URINALYSIS, ROUTINE W REFLEX MICROSCOPIC - Abnormal; Notable for the following:       Result Value   APPearance TURBID (*)    Hgb urine dipstick MODERATE (*)    Protein, ur >=300 (*)    Leukocytes, UA LARGE (*)    Bacteria, UA RARE (*)    Squamous Epithelial / LPF 0-5 (*)    Non Squamous Epithelial 0-5 (*)    All other components within normal limits  BASIC METABOLIC PANEL - Abnormal; Notable for the following:    Potassium 3.3 (*)    Chloride 100 (*)    Glucose, Bld 111 (*)    Creatinine, Ser 1.01 (*)  All other components within normal limits  CBC - Abnormal; Notable for the following:    WBC 15.6 (*)    MCH 25.2 (*)    All other components within normal limits  URINE CULTURE  I-STAT BETA HCG BLOOD, ED ([facility], [facility], [facility] ONLY)  I-STAT CG4 LACTIC ACID, ED    EKG  EKG Interpretation None       Radiology [facility] Renal  Result Date: 01/07/2016 CLINICAL DATA:  35 year old female with right flank pain radiating to the groin. EXAM: RENAL / URINARY TRACT ULTRASOUND COMPLETE COMPARISON:  Abdominal CT dated 12/30/2015 FINDINGS: Right Kidney: Length: 11 cm. Echogenicity within normal limits. There is an 8 mm nonobstructing stone in the interpolar aspect of the right kidney. No hydronephrosis. A 1.5 x 1.4 x 2.0 cm cyst is noted in the upper pole of the right kidney. Left Kidney: Length: 9 cm. Echogenicity within normal limits. No mass or hydronephrosis visualized. Bladder: Appears normal for degree of bladder distention. IMPRESSION: An 8 mm nonobstructing right renal interpolar stone. No hydronephrosis. A 2 cm right renal upper pole cyst. Electronically Signed   By: Elgie CollardArash  Radparvar M.D.   On: 01/07/2016  04:51   Ct Renal Stone Study  Result Date: 01/07/2016 CLINICAL DATA:  Right flank pain EXAM: CT ABDOMEN AND PELVIS WITHOUT CONTRAST TECHNIQUE: Multidetector CT imaging of the abdomen and pelvis was performed following the standard protocol without IV contrast. COMPARISON:  CT abdomen pelvis 12/30/2015 FINDINGS: Lower chest: Lung bases clearing Hepatobiliary: Cholecystectomy clips.  Liver and bile ducts normal. Pancreas: Negative Spleen: Negative Adrenals/Urinary Tract: Nonobstructing right lower pole renal calculi unchanged. Largest stone is 5 x 7 mm. Smaller stone 4 mm. Mild dilatation right renal pelvis with thickening of the renal pelvis and ureter. No obstructing ureteral stone. Diffuse low-density throughout the renal pyramids with mild swelling of the right kidney. Probable pyelonephritis. No left renal calculi. Left kidney is normal. Bladder is normal. No bladder calculi. Stomach/Bowel: Stomach and duodenum normal. Negative for bowel obstruction. Normal appendix. Vascular/Lymphatic: Negative Reproductive: Hysterectomy.  No pelvic mass Other: No free fluid or abscess. Musculoskeletal: Negative IMPRESSION: Small nonobstructing right lower pole renal calculi unchanged. Mild dilatation and thickening of the right pelvis and right ureter without obstructing stone. Low-density edema in the right renal pyramids with mild swelling of the right kidney. Findings are suggestive of pyelonephritis without abscess. Electronically Signed   By: Marlan Palauharles  Clark M.D.   On: 01/07/2016 06:50    Procedures Procedures (including critical care time)  Medications Ordered in ED Medications  oxyCODONE-acetaminophen (PERCOCET/ROXICET) 5-325 MG per tablet 1 tablet (1 tablet Oral Given 01/07/16 0239)  oxyCODONE-acetaminophen (PERCOCET/ROXICET) 5-325 MG per tablet (not administered)  tiZANidine (ZANAFLEX) tablet 2 mg (not administered)  Ketorolac Tromethamine SOLN 1 spray (not administered)  oxyCODONE (OXYCONTIN) 12 hr  tablet 10 mg (not administered)  promethazine (PHENERGAN) tablet 25 mg (not administered)  enoxaparin (LOVENOX) injection 40 mg (not administered)  0.9 %  sodium chloride infusion (not administered)  acetaminophen (TYLENOL) tablet 650 mg (not administered)    Or  acetaminophen (TYLENOL) suppository 650 mg (not administered)  ondansetron (ZOFRAN) tablet 4 mg (not administered)    Or  ondansetron (ZOFRAN) injection 4 mg (not administered)  HYDROmorphone (DILAUDID) injection 0.5 mg (not administered)  cefTRIAXone (ROCEPHIN) 1 g in dextrose 5 % 50 mL IVPB (not administered)  potassium chloride SA (K-DUR,KLOR-CON) CR tablet 20 mEq (not administered)  sodium chloride 0.9 % bolus 1,000 mL (0 mLs Intravenous Stopped 01/07/16 0638)  ondansetron (ZOFRAN)  injection 4 mg (4 mg Intravenous Given 01/07/16 0426)  morphine 4 MG/ML injection 4 mg (4 mg Intravenous Given 01/07/16 0422)  fentaNYL (SUBLIMAZE) injection 100 mcg (100 mcg Intravenous Given 01/07/16 0539)  cefTRIAXone (ROCEPHIN) 1 g in dextrose 5 % 50 mL IVPB (0 g Intravenous Stopped 01/07/16 0638)  promethazine (PHENERGAN) injection 25 mg (25 mg Intravenous Given 01/07/16 0538)  fentaNYL (SUBLIMAZE) injection 100 mcg (100 mcg Intravenous Given 01/07/16 0748)     Initial Impression / Assessment and Plan / ED Course  I have reviewed the triage vital signs and the nursing notes.  Pertinent labs & imaging results that were available during my care of the patient were reviewed by me and considered in my medical decision making (see chart for details).  Clinical Course    This is a 35 y.o. Female who presents to the emergency department complaining of 2 days of dysuria and constant right flank pain. Patient reports she's been having intermittent right flank pain for the past 2-3 weeks. She was admitted to the hospital earlier this month for pyelonephritis. She tells me that this improved and then 2 days ago her flank pain returned and is very  constant. She reports she also began having dysuria starting yesterday. She reports taking her oxycodone at home with little relief. On exam the patient is afebrile nontoxic appearing. Her abdomen is soft and nontender to palpation. Her flank tenderness is distractible. Pregnancy test is negative. Urinalysis shows large leukocytes and too numerous to count white blood cells. BMP is normal for a creatinine of 1.01. CBC is remote for white count of 15,600. Patient started on Rocephin for UTI. Renal ultrasound shows nonobstructing right renal stone. No hydronephrosis. CT renal stone study was obtained to rule out any abscess. Findings are suggestive pyelonephritis without abscess on CT scan. This is the patient's second documented pyelonephritis in less than a month. She has continued pain despite morphine, fentanyl and Dilaudid. Will admit to medicine for continued IV antibiotics and pain control. Patient agrees with plan.   I consulted with Toya Smothers, NP who accepted the patient for admission.   This patient was discussed with Dr. Wilkie Aye who agrees with assessment and plan.    Final Clinical Impressions(s) / ED Diagnoses   Final diagnoses:  Pyelonephritis    New Prescriptions New Prescriptions   No medications on file     Everlene Farrier, PA-C 01/07/16 0800    Shon Baton, MD 01/10/16 2250

## 2016-01-07 NOTE — ED Notes (Signed)
Spoke to NP about pt's BP consistently around 90 systolic. Fluid bolus to be ordered, will administer pain meds following fluids.

## 2016-01-07 NOTE — ED Notes (Signed)
Pt concerned about her pain and that we are not treating her pain. Pt offered zanaflex, but refused. Pt's BP is low 95/54. N, Black made aware of pt's concerns and BP. Pt has been given dilaudid, oxycontin per PRN orders. Pt reports 9/10 pain, is resting calmly in bed. Pt ate lunch.

## 2016-01-07 NOTE — Progress Notes (Signed)
Mary Morse is a 35 y.o. female patient admitted from ED awake, alert - oriented  X 4 - no acute distress noted.  VSS - Blood pressure 109/72, pulse 85, temperature 99.6 F (37.6 C), temperature source Oral, resp. rate 18, height 5\' 1"  (1.549 m), weight 90.5 kg (199 lb 9.6 oz), SpO2 98 %.    IV in place, occlusive dsg intact without redness.  Orientation to room, and floor completed with information packet given to patient/family.  Patient declined safety video at this time.  Admission INP armband ID verified with patient/family, and in place.   SR up x 2, fall assessment complete, with patient and family able to verbalize understanding of risk associated with falls, and verbalized understanding to call nsg before up out of bed.  Call light within reach, patient able to voice, and demonstrate understanding.   Abrasion to left shin with scab.    Will cont to eval and treat per MD orders.  Kendall FlackL'ESPERANCE, Krysti Hickling C, RN 01/07/2016 3:52 PM

## 2016-01-07 NOTE — ED Notes (Signed)
Istat lactic 0.79

## 2016-01-07 NOTE — Progress Notes (Signed)
Report received from Maralyn SagoSarah, RN for admission to [phone]

## 2016-01-07 NOTE — Progress Notes (Signed)
Patient continues to complain of 9/10 pain unrelieved from prn medications. Patient asking for "1mg  of dilaudid" saying "that helped last time". Patient refusing warm compress. Black, NP paged and stated to continue to maintain current pain medication regimen.

## 2016-01-08 DIAGNOSIS — G8929 Other chronic pain: Secondary | ICD-10-CM | POA: Diagnosis not present

## 2016-01-08 DIAGNOSIS — Z90710 Acquired absence of both cervix and uterus: Secondary | ICD-10-CM | POA: Diagnosis not present

## 2016-01-08 DIAGNOSIS — E669 Obesity, unspecified: Secondary | ICD-10-CM | POA: Diagnosis present

## 2016-01-08 DIAGNOSIS — Z765 Malingerer [conscious simulation]: Secondary | ICD-10-CM | POA: Diagnosis not present

## 2016-01-08 DIAGNOSIS — Z560 Unemployment, unspecified: Secondary | ICD-10-CM | POA: Diagnosis not present

## 2016-01-08 DIAGNOSIS — Z79899 Other long term (current) drug therapy: Secondary | ICD-10-CM | POA: Diagnosis not present

## 2016-01-08 DIAGNOSIS — Z9851 Tubal ligation status: Secondary | ICD-10-CM | POA: Diagnosis not present

## 2016-01-08 DIAGNOSIS — Z87442 Personal history of urinary calculi: Secondary | ICD-10-CM | POA: Diagnosis not present

## 2016-01-08 DIAGNOSIS — M545 Low back pain: Secondary | ICD-10-CM | POA: Diagnosis not present

## 2016-01-08 DIAGNOSIS — G43909 Migraine, unspecified, not intractable, without status migrainosus: Secondary | ICD-10-CM | POA: Diagnosis present

## 2016-01-08 DIAGNOSIS — E876 Hypokalemia: Secondary | ICD-10-CM | POA: Diagnosis present

## 2016-01-08 DIAGNOSIS — Z6836 Body mass index (BMI) 36.0-36.9, adult: Secondary | ICD-10-CM | POA: Diagnosis not present

## 2016-01-08 DIAGNOSIS — N10 Acute tubulo-interstitial nephritis: Secondary | ICD-10-CM | POA: Diagnosis not present

## 2016-01-08 DIAGNOSIS — Z809 Family history of malignant neoplasm, unspecified: Secondary | ICD-10-CM | POA: Diagnosis not present

## 2016-01-08 DIAGNOSIS — N12 Tubulo-interstitial nephritis, not specified as acute or chronic: Secondary | ICD-10-CM | POA: Diagnosis present

## 2016-01-08 DIAGNOSIS — D649 Anemia, unspecified: Secondary | ICD-10-CM | POA: Diagnosis present

## 2016-01-08 DIAGNOSIS — Z9049 Acquired absence of other specified parts of digestive tract: Secondary | ICD-10-CM | POA: Diagnosis not present

## 2016-01-08 LAB — BASIC METABOLIC PANEL
Anion gap: 4 — ABNORMAL LOW (ref 5–15)
BUN: 10 mg/dL (ref 6–20)
CO2: 30 mmol/L (ref 22–32)
Calcium: 8.3 mg/dL — ABNORMAL LOW (ref 8.9–10.3)
Chloride: 106 mmol/L (ref 101–111)
Creatinine, Ser: 0.86 mg/dL (ref 0.44–1.00)
GFR calc Af Amer: >60 mL/min (ref >60)
GFR calc non Af Amer: >60 mL/min (ref >60)
GLUCOSE: 125 mg/dL — AB (ref 65–99)
POTASSIUM: 4.2 mmol/L (ref 3.5–5.1)
Sodium: 140 mmol/L (ref 135–145)

## 2016-01-08 LAB — CBC
HEMATOCRIT: 32.1 % — AB (ref 36.0–46.0)
Hemoglobin: 10.1 g/dL — ABNORMAL LOW (ref 12.0–15.0)
MCH: 24.8 pg — ABNORMAL LOW (ref 26.0–34.0)
MCHC: 31.5 g/dL (ref 30.0–36.0)
MCV: 78.7 fL (ref 78.0–100.0)
Platelets: 141 10*3/uL — ABNORMAL LOW (ref 150–400)
RBC: 4.08 MIL/uL (ref 3.87–5.11)
RDW: 15.2 % (ref 11.5–15.5)
WBC: 8.2 10*3/uL (ref 4.0–10.5)

## 2016-01-08 MED ORDER — OXYCODONE HCL 5 MG PO TABS
10.0000 mg | ORAL_TABLET | ORAL | Status: DC | PRN
  Administered 2016-01-08 – 2016-01-09 (×4): 10 mg via ORAL
  Filled 2016-01-08 (×4): qty 2

## 2016-01-08 MED ORDER — ASPIRIN-ACETAMINOPHEN-CAFFEINE 250-250-65 MG PO TABS
1.0000 | ORAL_TABLET | Freq: Once | ORAL | Status: AC
  Administered 2016-01-08: 1 via ORAL
  Filled 2016-01-08: qty 1

## 2016-01-08 MED ORDER — HYDROMORPHONE HCL 2 MG/ML IJ SOLN
1.0000 mg | Freq: Once | INTRAMUSCULAR | Status: AC
  Administered 2016-01-08: 1 mg via INTRAVENOUS
  Filled 2016-01-08: qty 1

## 2016-01-08 NOTE — Progress Notes (Signed)
Pt c/o pain at 7/10 and states that she does not tolerate any pain . She has schedule oxycotin that is due to administer at 10pm , however pt asked for both oxycotin and oxycodone  at the same time and I explained I could not give both together . She received 0.5 dilaudid appx 1hr ago as well .  Pt became upset when I explained difference between prn and scheduled pain medication . She stated she would rather have her oxycodone instead of oxcotin if they could not be given together . I explained that I could give her the oxycodone as it was prn and it was in the window to give . She states she wants to speak to md in am to change her oxycodone and oxycotine to be given at the same time and she wants her dilaudid increased to 1 mg . She states she has been receiving 1mg  all day however there is no order to reflect such - there was a one time 1mg  dose given and the 0.5 mg dose resumed q4 thereafter . Pt appear alert oriented , and does not show signs of pain vs stable . oxycotin not given and pt given oxycodone for her said pain relief.

## 2016-01-08 NOTE — Progress Notes (Signed)
[facility] PROGRESS NOTE  Nori RiisDana Kataoka ZOX:[id number] DOB: 1980/04/04 DOA: 01/07/2016  [facility]  Brief History/Interval Summary: 35 year old Caucasian female with a past medical history of migraines, chronic back pain on narcotics, anxiety disorder, history of nephrolithiasis was recently hospitalized for pyelonephritis. Patient was discharged on 12/3 and given a prescription for Keflex. She was treated for 7 days. At that time patient came back with complaints of right-sided flank pain. She underwent imaging studies, which raised concern for pyelonephritis. She was hospitalized for further management.  Reason for Visit: Pyelonephritis  Consultants: Phone consultation with urology  Procedures: None  Antibiotics: Ceftriaxone  Subjective/Interval History: Patient continues to have right-sided flank pain which she rates at 9 out of 10 in intensity. Has had some nausea but no vomiting. Also complains of her migraine headaches.  ROS: Denies any shortness of breath  Objective:  Vital Signs  Vitals:   01/08/16 0730 01/08/16 0733 01/08/16 0947 01/08/16 1223  BP: (!) 81/53 (!) 92/56 117/62 113/64  Pulse: 63 65 78 67  Resp:    16  Temp:    97.8 F (36.6 C)  TempSrc:    Oral  SpO2:    97%  Weight:      Height:        Intake/Output Summary (Last 24 hours) at 01/08/16 1333 Last data filed at 01/08/16 1228  Gross per 24 hour  Intake          3205.01 ml  Output             3300 ml  Net           -94.99 ml   Filed Weights   01/07/16 0231 01/07/16 1541  Weight: 88.5 kg (195 lb) 90.5 kg (199 lb 9.6 oz)    General appearance: alert, cooperative, appears stated age and no distress Resp: clear to auscultation bilaterally Cardio: regular rate and rhythm, S1, S2 normal, no murmur, click, rub or gallop GI: She does have tenderness over her right CVA. Abdomen is benign. No masses or organomegaly. Extremities: extremities normal, atraumatic, no  cyanosis or edema Neurologic: Alert and oriented 3. No focal neurological deficits.  Lab Results:  Data Reviewed: I have personally reviewed following labs and imaging studies  CBC:  Recent Labs Lab 01/07/16 0237 01/08/16 0348  WBC 15.6* 8.2  HGB 12.3 10.1*  HCT 38.3 32.1*  MCV 78.3 78.7  PLT 194 141*    Basic Metabolic Panel:  Recent Labs Lab 01/07/16 0237 01/08/16 0348  NA 138 140  K 3.3* 4.2  CL 100* 106  CO2 27 30  GLUCOSE 111* 125*  BUN 14 10  CREATININE 1.01* 0.86  CALCIUM 9.0 8.3*    GFR: Estimated Creatinine Clearance: 92.7 mL/min (by C-G formula based on SCr of 0.86 mg/dL).  Radiology Studies: [facility]  Result Date: 01/07/2016 CLINICAL DATA:  35 year old female with right flank pain radiating to the groin. EXAM: RENAL / URINARY TRACT ULTRASOUND COMPLETE COMPARISON:  Abdominal CT dated 12/30/2015 FINDINGS: Right Kidney: Length: 11 cm. Echogenicity within normal limits. There is an 8 mm nonobstructing stone in the interpolar aspect of the right kidney. No hydronephrosis. A 1.5 x 1.4 x 2.0 cm cyst is noted in the upper pole of the right kidney. Left Kidney: Length: 9 cm. Echogenicity within normal limits. No mass or hydronephrosis visualized. Bladder: Appears normal for degree of bladder distention. IMPRESSION: An 8 mm nonobstructing right renal interpolar stone. No hydronephrosis. A 2 cm right renal  upper pole cyst. Electronically Signed   By: Elgie CollardArash  Radparvar M.D.   On: 01/07/2016 04:51   Ct Renal Stone Study  Result Date: 01/07/2016 CLINICAL DATA:  Right flank pain EXAM: CT ABDOMEN AND PELVIS WITHOUT CONTRAST TECHNIQUE: Multidetector CT imaging of the abdomen and pelvis was performed following the standard protocol without IV contrast. COMPARISON:  CT abdomen pelvis 12/30/2015 FINDINGS: Lower chest: Lung bases clearing Hepatobiliary: Cholecystectomy clips.  Liver and bile ducts normal. Pancreas: Negative Spleen: Negative Adrenals/Urinary Tract: Nonobstructing  right lower pole renal calculi unchanged. Largest stone is 5 x 7 mm. Smaller stone 4 mm. Mild dilatation right renal pelvis with thickening of the renal pelvis and ureter. No obstructing ureteral stone. Diffuse low-density throughout the renal pyramids with mild swelling of the right kidney. Probable pyelonephritis. No left renal calculi. Left kidney is normal. Bladder is normal. No bladder calculi. Stomach/Bowel: Stomach and duodenum normal. Negative for bowel obstruction. Normal appendix. Vascular/Lymphatic: Negative Reproductive: Hysterectomy.  No pelvic mass Other: No free fluid or abscess. Musculoskeletal: Negative IMPRESSION: Small nonobstructing right lower pole renal calculi unchanged. Mild dilatation and thickening of the right pelvis and right ureter without obstructing stone. Low-density edema in the right renal pyramids with mild swelling of the right kidney. Findings are suggestive of pyelonephritis without abscess. Electronically Signed   By: Marlan Palauharles  Clark M.D.   On: 01/07/2016 06:50     Medications:  Scheduled: . cefTRIAXone (ROCEPHIN)  IV  1 g Intravenous Q24H  . enoxaparin (LOVENOX) injection  40 mg Subcutaneous Q24H  . fluconazole  150 mg Oral Daily  . oxyCODONE  10 mg Oral BID  . phenazopyridine  200 mg Oral TID WC   Continuous: . sodium chloride 100 mL/hr at 01/08/16 0054   ZOX:[id number]:acetaminophen **OR** acetaminophen, HYDROmorphone (DILAUDID) injection, ondansetron **OR** ondansetron (ZOFRAN) IV, oxyCODONE, promethazine, tiZANidine  Assessment/Plan:  Principal Problem:   Acute pyelonephritis Active Problems:   Chronic back pain   Hypokalemia   Obesity    Acute pyelonephritis  During her previous hospitalization she was treated with antibiotics for total of 7 days. Perhaps this might have been the reason for her recurrent presentation. Abnormal CT findings have been discussed with urology. 14 day treatment is recommended. Continue ceftriaxone for another day. If she feels  better tomorrow, we will change her to oral medications and then she can go home. Previous urine culture was positive for Escherichia coli. Sensitivities reviewed. Follow up on cultures from this admission as well.  Chronic back pain. Patient states that she [place] pain [place]  History of migraines. Stable. Continue to monitor.  Hypokalemia. Resolved.  Normocytic anemia. Stable. No evidence for overt bleeding.   DVT Prophylaxis: Lovenox    Code Status: FullCode  Family Communication: Discussed with the patient  Disposition Plan: Management as outlined above. Mobilize.   LOS: 0 days   Sycamore SpringsKRISHNAN,Alaiyah Bollman  [facility] Pager [phone] 01/08/2016, 1:33 PM  If 7PM-7AM, please contact night-coverage at [email or web address], password [email or web address]

## 2016-01-09 LAB — CBC
HEMATOCRIT: 32.9 % — AB (ref 36.0–46.0)
HEMOGLOBIN: 10.1 g/dL — AB (ref 12.0–15.0)
MCH: 24.2 pg — ABNORMAL LOW (ref 26.0–34.0)
MCHC: 30.7 g/dL (ref 30.0–36.0)
MCV: 78.9 fL (ref 78.0–100.0)
Platelets: 152 10*3/uL (ref 150–400)
RBC: 4.17 MIL/uL (ref 3.87–5.11)
RDW: 15 % (ref 11.5–15.5)
WBC: 8.1 10*3/uL (ref 4.0–10.5)

## 2016-01-09 LAB — BASIC METABOLIC PANEL
ANION GAP: 7 (ref 5–15)
BUN: 9 mg/dL (ref 6–20)
CALCIUM: 8.6 mg/dL — AB (ref 8.9–10.3)
CO2: 28 mmol/L (ref 22–32)
Chloride: 104 mmol/L (ref 101–111)
Creatinine, Ser: 0.79 mg/dL (ref 0.44–1.00)
GFR calc non Af Amer: >60 mL/min (ref >60)
Glucose, Bld: 117 mg/dL — ABNORMAL HIGH (ref 65–99)
Potassium: 3.8 mmol/L (ref 3.5–5.1)
Sodium: 139 mmol/L (ref 135–145)

## 2016-01-09 LAB — URINE CULTURE

## 2016-01-09 MED ORDER — CEFPODOXIME PROXETIL 200 MG PO TABS: 200.0000 mg | ORAL_TABLET | Freq: Two times a day (BID) | ORAL | 0 refills | Status: DC

## 2016-01-09 MED ORDER — CEFPODOXIME PROXETIL 200 MG PO TABS
200.0000 mg | ORAL_TABLET | Freq: Two times a day (BID) | ORAL | Status: DC
Start: 2016-01-09 — End: 2016-01-09
  Administered 2016-01-09: 200 mg via ORAL
  Filled 2016-01-09: qty 1

## 2016-01-09 MED ORDER — ONDANSETRON HCL 4 MG PO TABS: 4.0000 mg | ORAL_TABLET | Freq: Four times a day (QID) | ORAL | 0 refills | Status: DC

## 2016-01-09 MED ORDER — PHENAZOPYRIDINE HCL 200 MG PO TABS: 200.0000 mg | ORAL_TABLET | Freq: Three times a day (TID) | ORAL | 0 refills | Status: DC

## 2016-01-09 NOTE — Progress Notes (Signed)
Mary Morse to be D/C'd to home per MD order.  Discussed with the patient and all questions fully answered.  VSS, Skin clean, dry and intact without evidence of skin break down, no evidence of skin tears noted. IV catheter discontinued intact. Site without signs and symptoms of complications. Dressing and pressure applied.  An After Visit Summary was printed and given to the patient. Patient received prescriptions.  D/c education completed with patient/family including follow up instructions, medication list, d/c activities limitations if indicated, with other d/c instructions as indicated by MD - patient able to verbalize understanding, all questions fully answered.   Patient instructed to return to ED, call 911, or call MD for any changes in condition.   Patient requested to go to emergency room entrance to inquire about missing shoes that did not make their way up to the [place] [address] [place]. [facility] to [facility]. Shoes were not found and patient opted to go home with family member and come back another day to look for shoes.  Joellyn HaffKayla L Price 01/09/2016 2:50 PM

## 2016-01-09 NOTE — Progress Notes (Signed)
She c/o of pain at 8/10 - dilaudid given the patient reassura educated about the non- pahrmacological pain therapies for pain management .pt reassured that as her infection clears up  She would be in less pain . Pt seems to be very defensive about being educated about pain therapy .when I explained that she should not refuse the nonpharm therapies offered to her and other way to manage her pain she become defensive . However ,  I offered her a warm compress to aid in her pain . She accepted . she was adamant about  Her reasoning for coming into the hospital was to manage her pain . I explained - although we went to mage her patient we want to treat her so she is able to manage pain at home .

## 2016-01-09 NOTE — Discharge Summary (Addendum)
[facility]  Physician Discharge Summary   Patient ID: Mary RiisDana Wile MRN: [id number] DOB/AGE: 1981-01-06 35 y.o.  Admit date: 01/07/2016 Discharge date: 01/09/2016  PCP: [facility]  DISCHARGE DIAGNOSES:  Principal Problem:   Acute pyelonephritis Active Problems:   Chronic back pain   Hypokalemia   Obesity   RECOMMENDATIONS FOR OUTPATIENT FOLLOW UP: 1. Patient to be prescribed a two-week course of antibiotics for pyelonephritis 2. She should follow-up with her primary care provider within a week  DISCHARGE CONDITION: fair  Diet recommendation: As before  Filed Weights   01/07/16 0231 01/07/16 1541  Weight: 88.5 kg (195 lb) 90.5 kg (199 lb 9.6 oz)    INITIAL HISTORY: 35 year old Caucasian female with a past medical history of migraines, chronic back pain on narcotics, anxiety disorder, history of nephrolithiasis was recently hospitalized for pyelonephritis. Patient was discharged on 12/3 and given a prescription for Keflex. She was treated for 7 days. At that time patient came back with complaints of right-sided flank pain. She underwent imaging studies, which raised concern for pyelonephritis. She was hospitalized for further management.  Consultations: Phone consultation with urology   HOSPITAL COURSE:   Acute pyelonephritis  During her previous hospitalization she was treated with antibiotics for total of 7 days. Perhaps this short course of treatment might have been the reason for her recurrent presentation. Abnormal CT findings have been discussed with urology. 14 day treatment is recommended. Patient was treated initially with ceftriaxone. Urine cultures were repeated and show Escherichia coli. Sensitivities reviewed. She will be discharged on Vantin. She continues to have flank pain but has had no nausea or vomiting. There is an element of narcotic drug-seeking behavior.  Chronic back pain. Patient states that she gets her pain  medications from [place] pain medication specialist in [place]. Stable. Ambulating.  History of migraines. Stable.   Hypokalemia. Resolved.  Normocytic anemia. Stable. No evidence for overt bleeding.  Overall, stable. Okay for discharge today.  PERTINENT LABS:  The results of significant diagnostics from this hospitalization (including imaging, microbiology, ancillary and laboratory) are listed below for reference.    Microbiology: Recent Results (from the past 240 hour(s))  Urine culture     Status: Abnormal   Collection Time: 01/07/16  2:49 AM  Result Value Ref Range Status   Specimen Description URINE, RANDOM  Final   Special Requests ADDED [phone]  Final   Culture 50,000 COLONIES/mL ESCHERICHIA COLI (A)  Final   Report Status 01/09/2016 FINAL  Final   Organism ID, Bacteria ESCHERICHIA COLI (A)  Final      Susceptibility   Escherichia coli - MIC*    AMPICILLIN 8 SENSITIVE Sensitive     CEFAZOLIN <=4 SENSITIVE Sensitive     CEFTRIAXONE <=1 SENSITIVE Sensitive     CIPROFLOXACIN <=0.25 SENSITIVE Sensitive     GENTAMICIN <=1 SENSITIVE Sensitive     IMIPENEM <=0.25 SENSITIVE Sensitive     NITROFURANTOIN <=16 SENSITIVE Sensitive     TRIMETH/SULFA <=20 SENSITIVE Sensitive     AMPICILLIN/SULBACTAM 4 SENSITIVE Sensitive     PIP/TAZO <=4 SENSITIVE Sensitive     Extended ESBL NEGATIVE Sensitive     * 50,000 COLONIES/mL ESCHERICHIA COLI     Labs: Basic Metabolic Panel:  Recent Labs Lab 01/07/16 0237 01/08/16 0348 01/09/16 0414  NA 138 140 139  K 3.3* 4.2 3.8  CL 100* 106 104  CO2 27 30 28   GLUCOSE 111* 125* 117*  BUN 14 10 9   CREATININE 1.01* 0.86 0.79  CALCIUM 9.0 8.3* 8.6*   CBC:  Recent Labs Lab 01/07/16 0237 01/08/16 0348 01/09/16 0414  WBC 15.6* 8.2 8.1  HGB 12.3 10.1* 10.1*  HCT 38.3 32.1* 32.9*  MCV 78.3 78.7 78.9  PLT 194 141* 152     IMAGING STUDIES US Renal  Result Date: 01/07/2016 CLINICAL DATA:  35 year old female with right  flank pain radiating to the groin. EXAM: RENAL / URINARY TRACT ULTRASOUND COMPLETE COMPARISON:  Abdominal CT dated 12/30/2015 FINDINGS: Right Kidney: Length: 11 cm. Echogenicity within normal limits. There is an 8 mm nonobstructing stone in the interpolar aspect of the right kidney. No hydronephrosis. A 1.5 x 1.4 x 2.0 cm cyst is noted in the upper pole of the right kidney. Left Kidney: Length: 9 cm. Echogenicity within normal limits. No mass or hydronephrosis visualized. Bladder: Appears normal for degree of bladder distention. IMPRESSION: An 8 mm nonobstructing right renal interpolar stone. No hydronephrosis. A 2 cm right renal upper pole cyst. Electronically Signed   By: Elgie Collard M.D.   On: 01/07/2016 04:51   Ct Renal Stone Study  Result Date: 01/07/2016 CLINICAL DATA:  Right flank pain EXAM: CT ABDOMEN AND PELVIS WITHOUT CONTRAST TECHNIQUE: Multidetector CT imaging of the abdomen and pelvis was performed following the standard protocol without IV contrast. COMPARISON:  CT abdomen pelvis 12/30/2015 FINDINGS: Lower chest: Lung bases clearing Hepatobiliary: Cholecystectomy clips.  Liver and bile ducts normal. Pancreas: Negative Spleen: Negative Adrenals/Urinary Tract: Nonobstructing right lower pole renal calculi unchanged. Largest stone is 5 x 7 mm. Smaller stone 4 mm. Mild dilatation right renal pelvis with thickening of the renal pelvis and ureter. No obstructing ureteral stone. Diffuse low-density throughout the renal pyramids with mild swelling of the right kidney. Probable pyelonephritis. No left renal calculi. Left kidney is normal. Bladder is normal. No bladder calculi. Stomach/Bowel: Stomach and duodenum normal. Negative for bowel obstruction. Normal appendix. Vascular/Lymphatic: Negative Reproductive: Hysterectomy.  No pelvic mass Other: No free fluid or abscess. Musculoskeletal: Negative IMPRESSION: Small nonobstructing right lower pole renal calculi unchanged. Mild dilatation and thickening  of the right pelvis and right ureter without obstructing stone. Low-density edema in the right renal pyramids with mild swelling of the right kidney. Findings are suggestive of pyelonephritis without abscess. Electronically Signed   By: Marlan Palau M.D.   On: 01/07/2016 06:50    DISCHARGE EXAMINATION: Vitals:   01/08/16 1223 01/08/16 1614 01/08/16 2101 01/09/16 0500  BP: 113/64 115/70 116/77 (!) 95/53  Pulse: 67 65 74 63  Resp: 16 15 19 19   Temp: 97.8 F (36.6 C) 98.1 F (36.7 C) 98.1 F (36.7 C) 98.4 F (36.9 C)  TempSrc: Oral Oral Oral   SpO2: 97% 100% 100% 98%  Weight:      Height:       General appearance: alert, cooperative, appears stated age, no distress and moderately obese Resp: clear to auscultation bilaterally Cardio: regular rate and rhythm, S1, S2 normal, no murmur, click, rub or gallop GI: soft, non-tender; bowel sounds normal; no masses,  no organomegaly Extremities: extremities normal, atraumatic, no cyanosis or edema  DISPOSITION: Home  Discharge Instructions    Call MD for:  difficulty breathing, headache or visual disturbances    Complete by:  As directed    Call MD for:  extreme fatigue    Complete by:  As directed    Call MD for:  persistant dizziness or light-headedness    Complete by:  As directed    Call MD for:  persistant nausea  and vomiting    Complete by:  As directed    Discharge instructions    Complete by:  As directed    Please follow up with your PCP within 1 week. Please seek attention if you develop high fevers, nausea and vomiting.  You were cared for by a hospitalist during your hospital stay. If you have any questions about your discharge medications or the care you received while you were in the hospital after you are discharged, you can call the unit and asked to speak with the hospitalist on call if the hospitalist that took care of you is not available. Once you are discharged, your primary care physician will handle any further  medical issues. Please note that NO REFILLS for any discharge medications will be authorized once you are discharged, as it is imperative that you return to your primary care physician (or establish a relationship with a primary care physician if you do not have one) for your aftercare needs so that they can reassess your need for medications and monitor your lab values. If you do not have a primary care physician, you can call [phone] for a physician referral.   Increase activity slowly    Complete by:  As directed       ALLERGIES: No Known Allergies   Current Discharge Medication List    START taking these medications   Details  cefpodoxime (VANTIN) 200 MG tablet Take 1 tablet (200 mg total) by mouth every 12 (twelve) hours. For 13 more days Qty: 26 tablet, Refills: 0      CONTINUE these medications which have CHANGED   Details  ondansetron (ZOFRAN) 4 MG tablet Take 1 tablet (4 mg total) by mouth every 6 (six) hours. Qty: 12 tablet, Refills: 0      CONTINUE these medications which have NOT CHANGED   Details  Ketorolac Tromethamine (SPRIX) 15.75 MG/SPRAY SOLN Place 1 spray into the nose daily as needed (congestion).    oxyCODONE-acetaminophen (PERCOCET/ROXICET) 5-325 MG tablet Take 1-2 tablets by mouth every 6 (six) hours as needed. Qty: 10 tablet, Refills: 0    OXYCONTIN 10 MG 12 hr tablet Take 10 mg by mouth 2 (two) times daily. Refills: 0    promethazine (PHENERGAN) 25 MG tablet Take 25 mg by mouth every 6 (six) hours as needed for nausea.     tiZANidine (ZANAFLEX) 2 MG tablet Take 2 mg by mouth every 6 (six) hours as needed for muscle spasms.      STOP taking these medications     cephALEXin (KEFLEX) 500 MG capsule          Follow-up Information    [facility]. Schedule an appointment as soon as possible for a visit in 1 week(s).   Contact information: [address] [place] [id number] [phone]           TOTAL DISCHARGE  TIME: 35 minutes  [facility] District HospitalKRISHNAN,Angas Isabell  [facility] Pager [phone]  01/09/2016, 1:26 PM

## 2016-01-09 NOTE — Discharge Instructions (Signed)
Pyelonephritis, Adult °Introduction °Pyelonephritis is a kidney infection. The kidneys are organs that help clean your blood by moving waste out of your blood and into your pee (urine). This infection can happen quickly, or it can last for a long time. In most cases, it clears up with treatment and does not cause other problems. °Follow these instructions at home: °Medicines °· Take over-the-counter and prescription medicines only as told by your doctor. °· Take your antibiotic medicine as told by your doctor. Do not stop taking the medicine even if you start to feel better. °General instructions °· Drink enough fluid to keep your pee clear or pale yellow. °· Avoid caffeine, tea, and carbonated drinks. °· Pee (urinate) often. Avoid holding in pee for long periods of time. °· Pee before and after sex. °· After pooping (having a bowel movement), women should wipe from front to back. Use each tissue only once. °· Keep all follow-up visits as told by your doctor. This is important. °Contact a doctor if: °· You do not feel better after 2 days. °· Your symptoms get worse. °· You have a fever. °Get help right away if: °· You cannot take your medicine or drink fluids as told. °· You have chills and shaking. °· You throw up (vomit). °· You have very bad pain in your side (flank) or back. °· You feel very weak or you pass out (faint). °This information is not intended to replace advice given to you by your health care provider. Make sure you discuss any questions you have with your health care provider. °Document Released: 02/18/2004 Document Revised: 06/18/2015 Document Reviewed: 05/05/2014 °© 2017 Elsevier ° °

## 2016-02-01 ENCOUNTER — Emergency Department (HOSPITAL_COMMUNITY)
Admission: EM | Admit: 2016-02-01 | Discharge: 2016-02-01 | Disposition: A | Discharge status: Home / Self Care | Payer: BLUE CROSS/BLUE SHIELD | Attending: Emergency Medicine | Admitting: Emergency Medicine

## 2016-02-01 ENCOUNTER — Encounter (HOSPITAL_COMMUNITY): Payer: Self-pay

## 2016-02-01 ENCOUNTER — Emergency Department (HOSPITAL_COMMUNITY): Payer: BLUE CROSS/BLUE SHIELD

## 2016-02-01 DIAGNOSIS — R109 Unspecified abdominal pain: Secondary | ICD-10-CM | POA: Diagnosis present

## 2016-02-01 DIAGNOSIS — N200 Calculus of kidney: Secondary | ICD-10-CM | POA: Diagnosis not present

## 2016-02-01 DIAGNOSIS — G8929 Other chronic pain: Secondary | ICD-10-CM

## 2016-02-01 LAB — URINALYSIS, ROUTINE W REFLEX MICROSCOPIC
Bilirubin Urine: NEGATIVE
GLUCOSE, UA: NEGATIVE mg/dL
HGB URINE DIPSTICK: NEGATIVE
KETONES UR: NEGATIVE mg/dL
Nitrite: NEGATIVE
PROTEIN: NEGATIVE mg/dL
Specific Gravity, Urine: 1.023 (ref 1.005–1.030)
pH: 5 (ref 5.0–8.0)

## 2016-02-01 LAB — I-STAT BETA HCG BLOOD, ED (MC, WL, AP ONLY): I-stat hCG, quantitative: <5 m[IU]/mL (ref <5)

## 2016-02-01 LAB — COMPREHENSIVE METABOLIC PANEL
ALBUMIN: 4.1 g/dL (ref 3.5–5.0)
ALT: 26 U/L (ref 14–54)
AST: 24 U/L (ref 15–41)
Alkaline Phosphatase: 79 U/L (ref 38–126)
Anion gap: 8 (ref 5–15)
BUN: 11 mg/dL (ref 6–20)
CHLORIDE: 104 mmol/L (ref 101–111)
CO2: 26 mmol/L (ref 22–32)
CREATININE: 0.81 mg/dL (ref 0.44–1.00)
Calcium: 9.5 mg/dL (ref 8.9–10.3)
GFR calc Af Amer: >60 mL/min (ref >60)
GLUCOSE: 118 mg/dL — AB (ref 65–99)
POTASSIUM: 3.6 mmol/L (ref 3.5–5.1)
SODIUM: 138 mmol/L (ref 135–145)
Total Bilirubin: 0.5 mg/dL (ref 0.3–1.2)
Total Protein: 8.4 g/dL — ABNORMAL HIGH (ref 6.5–8.1)

## 2016-02-01 LAB — CBC WITH DIFFERENTIAL/PLATELET
BASOS ABS: 0 10*3/uL (ref 0.0–0.1)
BASOS PCT: 0 %
EOS ABS: 0 10*3/uL (ref 0.0–0.7)
Eosinophils Relative: 0 %
HCT: 39.9 % (ref 36.0–46.0)
Hemoglobin: 13 g/dL (ref 12.0–15.0)
LYMPHS PCT: 12 %
Lymphs Abs: 1.3 10*3/uL (ref 0.7–4.0)
MCH: 25.3 pg — ABNORMAL LOW (ref 26.0–34.0)
MCHC: 32.6 g/dL (ref 30.0–36.0)
MCV: 77.8 fL — AB (ref 78.0–100.0)
Monocytes Absolute: 0.4 10*3/uL (ref 0.1–1.0)
Monocytes Relative: 4 %
Neutro Abs: 8.6 10*3/uL — ABNORMAL HIGH (ref 1.7–7.7)
Neutrophils Relative %: 84 %
Platelets: 174 10*3/uL (ref 150–400)
RBC: 5.13 MIL/uL — AB (ref 3.87–5.11)
RDW: 15.3 % (ref 11.5–15.5)
WBC: 10.2 10*3/uL (ref 4.0–10.5)

## 2016-02-01 LAB — I-STAT CG4 LACTIC ACID, ED: Lactic Acid, Venous: 0.9 mmol/L (ref 0.5–1.9)

## 2016-02-01 MED ORDER — METOCLOPRAMIDE HCL 5 MG/ML IJ SOLN
10.0000 mg | Freq: Once | INTRAMUSCULAR | Status: AC
Start: 2016-02-01 — End: 2016-02-01
  Administered 2016-02-01: 10 mg via INTRAVENOUS
  Filled 2016-02-01: qty 2

## 2016-02-01 MED ORDER — HYDROMORPHONE HCL 2 MG/ML IJ SOLN
1.0000 mg | Freq: Once | INTRAMUSCULAR | Status: DC
  Filled 2016-02-01: qty 1

## 2016-02-01 MED ORDER — ONDANSETRON HCL 4 MG/2ML IJ SOLN
4.0000 mg | Freq: Once | INTRAMUSCULAR | Status: AC
  Administered 2016-02-01: 4 mg via INTRAVENOUS
  Filled 2016-02-01: qty 2

## 2016-02-01 MED ORDER — ONDANSETRON 4 MG PO TBDP
4.0000 mg | ORAL_TABLET | Freq: Once | ORAL | Status: AC | PRN
  Administered 2016-02-01: 4 mg via ORAL

## 2016-02-01 MED ORDER — PROMETHAZINE HCL 25 MG RE SUPP: 25.0000 mg | Freq: Four times a day (QID) | RECTAL | 0 refills | Status: DC | PRN

## 2016-02-01 MED ORDER — ONDANSETRON 4 MG PO TBDP
ORAL_TABLET | ORAL | Status: AC
  Filled 2016-02-01: qty 1

## 2016-02-01 MED ORDER — SODIUM CHLORIDE 0.9 % IV BOLUS (SEPSIS): 1000.0000 mL | Freq: Once | INTRAVENOUS | Status: DC

## 2016-02-01 MED ORDER — HYDROMORPHONE HCL 2 MG/ML IJ SOLN
1.0000 mg | Freq: Once | INTRAMUSCULAR | Status: AC
  Administered 2016-02-01: 1 mg via INTRAVENOUS
  Filled 2016-02-01: qty 1

## 2016-02-01 MED ORDER — SODIUM CHLORIDE 0.9 % IV BOLUS (SEPSIS)
1000.0000 mL | Freq: Once | INTRAVENOUS | Status: AC
  Administered 2016-02-01: 1000 mL via INTRAVENOUS

## 2016-02-01 NOTE — ED Notes (Signed)
Nausea/Pain meds aren't helping per pt.

## 2016-02-01 NOTE — ED Provider Notes (Signed)
[facility]-EMERGENCY DEPT Provider Note   CSN: [id number] Arrival date & time: 02/01/16  7829     History   Chief Complaint Chief Complaint  Patient presents with  . Back Pain  . Flank Pain    HPI Mary Morse is a 36 y.o. female.  HPI 3 old female with past medical history as below, most relevant for recent admissions for pyelonephritis with possible component of narcotic seeking presents with persistent right flank pain. Patient was just discharged approximately week and a half ago. She states that she felt better at that time. She has a known 7 mm stone in the right kidney and is currently being set up for lithotripsy according to her report. Over the last 24 hours, she developed recurrence of an aching, gnawing, crampy-like right flank pain. The pain occasionally radiates down towards her right flank. Denies any associated dysuria or frequency. No vaginal bleeding or discharge. No fevers. She has had some nausea and vomiting with this which is usual for her. Pain feels similar to her recent episode of pyelonephritis as well as renal call.  Past Medical History:  Diagnosis Date  . Acute pyelonephritis 12/2015; 01/07/2016  . Anxiety   . Borderline diabetes   . Chronic back pain    "sometimes all over" (01/07/2016)  . Complication of anesthesia    felt that the first surgery she had like she had a hard time waking up.  . Constipation   . GERD (gastroesophageal reflux disease)    "when I was pregnant; now only q once in awhile" (01/07/2016)  . History of kidney stones   . Migraine    "2-3 times/month" (01/07/2016)    Patient Active Problem List   Diagnosis Date Noted  . Hypokalemia 01/07/2016  . Obesity 01/07/2016  . Recurrent pyelonephritis   . History of nephrolithiasis   . Acute pyelonephritis 12/26/2015  . Migraine 12/26/2015  . Chronic back pain   . Gallstones 11/13/2012  . Renal calculus, right 11/11/2012    Past Surgical History:  Procedure Laterality Date  .  ABDOMINAL HYSTERECTOMY  05/2011  . CHOLECYSTECTOMY N/A 12/06/2012   Procedure: LAPAROSCOPIC CHOLECYSTECTOMY WITH INTRAOPERATIVE CHOLANGIOGRAM;  Surgeon: Robyne Askew, MD;  [facility];  Service: General;  Laterality: N/A;  . LITHOTRIPSY  ~ 2010; 10/2012  . REDUCTION MAMMAPLASTY Bilateral 2013  . TUBAL LIGATION  2007    OB History    No data available       Home Medications    Prior to Admission medications   Medication Sig Start Date End Date Taking? Authorizing Provider  Ketorolac Tromethamine (SPRIX) 15.75 MG/SPRAY SOLN Place 1 spray into the nose daily as needed (congestion).   Yes Historical Provider, MD  ondansetron (ZOFRAN) 4 MG tablet Take 1 tablet (4 mg total) by mouth every 6 (six) hours. Patient taking differently: Take 4 mg by mouth every 8 (eight) hours as needed for nausea.  01/09/16  Yes Osvaldo Shipper, MD  Oxycodone HCl 10 MG TABS Take 10 mg by mouth 3 (three) times daily.   Yes Historical Provider, MD  tiZANidine (ZANAFLEX) 2 MG tablet Take 2 mg by mouth every 6 (six) hours as needed for muscle spasms.   Yes Historical Provider, MD  cefpodoxime (VANTIN) 200 MG tablet Take 1 tablet (200 mg total) by mouth every 12 (twelve) hours. For 13 more days Patient not taking: Reported on 02/01/2016 01/09/16   Osvaldo Shipper, MD  oxyCODONE-acetaminophen (PERCOCET/ROXICET) 5-325 MG tablet Take 1-2 tablets by mouth every  6 (six) hours as needed. Patient not taking: Reported on 02/01/2016 12/30/15   Marlon Peliffany Greene, PA-C  promethazine (PHENERGAN) 25 MG suppository Place 1 suppository (25 mg total) rectally every 6 (six) hours as needed for refractory nausea / vomiting. 02/01/16   Shaune Pollackameron Jowel Waltner, MD    Family History Family History  Problem Relation Age of Onset  . Cancer Father     prostate  . Cancer Paternal Grandmother     breast    Social History Social History  Substance Use Topics  . Smoking status: Never Smoker  . Smokeless tobacco: Never Used  . Alcohol use No      Allergies   Patient has no known allergies.   Review of Systems Review of Systems  Constitutional: Positive for fatigue. Negative for chills and fever.  HENT: Negative for congestion, rhinorrhea and sore throat.   Eyes: Negative for visual disturbance.  Respiratory: Negative for cough, shortness of breath and wheezing.   Cardiovascular: Negative for chest pain and leg swelling.  Gastrointestinal: Positive for abdominal pain, nausea and vomiting. Negative for diarrhea.  Genitourinary: Positive for frequency. Negative for dysuria, flank pain, vaginal bleeding and vaginal discharge.  Musculoskeletal: Negative for neck pain.  Skin: Negative for rash.  Allergic/Immunologic: Negative for immunocompromised state.  Neurological: Negative for syncope and headaches.  Hematological: Does not bruise/bleed easily.  All other systems reviewed and are negative.    Physical Exam Updated Vital Signs BP 107/69 (BP Location: Right Arm)   Pulse 71   Temp 98.5 F (36.9 C) (Oral)   Resp 16   Ht 5\' 1"  (1.549 m)   Wt 200 lb (90.7 kg)   SpO2 100%   BMI 37.79 kg/m   Physical Exam  Constitutional: She is oriented to person, place, and time. She appears well-developed and well-nourished. No distress.  HENT:  Head: Normocephalic and atraumatic.  Eyes: Conjunctivae are normal.  Neck: Neck supple.  Cardiovascular: Normal rate, regular rhythm and normal heart sounds.  Exam reveals no friction rub.   No murmur heard. Pulmonary/Chest: Effort normal and breath sounds normal. No respiratory distress. She has no wheezes. She has no rales.  Abdominal: Soft. Bowel sounds are normal. She exhibits no distension. There is tenderness (Moderate right CVA tenderness, right upper quadrant tenderness, and right lower quadrant tenderness.). There is no rebound and no guarding.  Musculoskeletal: She exhibits no edema.  Neurological: She is alert and oriented to person, place, and time. She exhibits normal  muscle tone.  Skin: Skin is warm. Capillary refill takes less than 2 seconds.  Psychiatric: She has a normal mood and affect.  Nursing note and vitals reviewed.    ED Treatments / Results  Labs (all labs ordered are listed, but only abnormal results are displayed) Labs Reviewed  URINALYSIS, ROUTINE W REFLEX MICROSCOPIC - Abnormal; Notable for the following:       Result Value   Color, Urine AMBER (*)    APPearance HAZY (*)    Leukocytes, UA TRACE (*)    Bacteria, UA RARE (*)    Squamous Epithelial / LPF 6-30 (*)    All other components within normal limits  CBC WITH DIFFERENTIAL/PLATELET - Abnormal; Notable for the following:    RBC 5.13 (*)    MCV 77.8 (*)    MCH 25.3 (*)    Neutro Abs 8.6 (*)    All other components within normal limits  COMPREHENSIVE METABOLIC PANEL - Abnormal; Notable for the following:    Glucose, Bld 118 (*)  Total Protein 8.4 (*)    All other components within normal limits  I-STAT CG4 LACTIC ACID, ED  I-STAT BETA HCG BLOOD, ED ([facility], [facility], [facility] ONLY)  I-STAT CG4 LACTIC ACID, ED    EKG  EKG Interpretation None       Radiology US Renal  Result Date: 02/01/2016 CLINICAL DATA:  Right flank pain EXAM: RENAL / URINARY TRACT ULTRASOUND COMPLETE COMPARISON:  CT scan 01/07/2016 FINDINGS: Right Kidney: Length: 11.9 cm. No hydronephrosis. Normal renal echogenicity. There is 1.2 cm calcified shadowing calculus in lower pole. Left Kidney: Length: 11.1 cm. Echogenicity within normal limits. No mass or hydronephrosis visualized. Bladder: Appears normal for degree of bladder distention. IMPRESSION: There is right nonobstructive nephrolithiasis. Largest calculus in lower pole measures 1.2 cm. No hydronephrosis. Electronically Signed   By: Natasha Mead M.D.   On: 02/01/2016 12:16    Procedures Procedures (including critical care time)  Medications Ordered in ED Medications  sodium chloride 0.9 % bolus 1,000 mL (1,000 mLs Intravenous Not Given 02/01/16 1246)   ondansetron (ZOFRAN-ODT) disintegrating tablet 4 mg (4 mg Oral Given 02/01/16 0729)  sodium chloride 0.9 % bolus 1,000 mL (1,000 mLs Intravenous New Bag/Given 02/01/16 1026)  HYDROmorphone (DILAUDID) injection 1 mg (1 mg Intravenous Given 02/01/16 1029)  ondansetron (ZOFRAN) injection 4 mg (4 mg Intravenous Given 02/01/16 1029)  metoCLOPramide (REGLAN) injection 10 mg (10 mg Intravenous Given 02/01/16 1243)  HYDROmorphone (DILAUDID) injection 1 mg (1 mg Intravenous Given 02/01/16 1243)     Initial Impression / Assessment and Plan / ED Course  I have reviewed the triage vital signs and the nursing notes.  Pertinent labs & imaging results that were available during my care of the patient were reviewed by me and considered in my medical decision making (see chart for details).  Clinical Course     36 yo F with h/o recurrent kidney stones, UTIs, recent admission for pyelo and chronic flank pain here with acute onset right flank pain. VSS and WNL. Eaxm is as above. DDx includes recurrent renal colic, UTI with pyelo (though less likely with no fever, well appearance), renal abscess, also per review of records there is c/f possible drug-seeking behavior. Will check labs, renal U/S given recent CT, and re-assess.  Labs, imaging as above. CBC with no leukocytosis. CMP unremarkable with normal renal function. UA without hematuria, pyuria, or significant evidence to suggest infection. Renal U/S again shows kidney stones but no hydro, no obstruction. LA normal. UPT negative.  Discussed labs, findings with pt. She is requesting for additional analgesia. I am concerned there may be a component of opioid seeking as pt does have h/o same. I have given her analgesia here but will not provide further narcotics as she has a chronic pain PA Qu[place] Fetter) per review of [place] database and is on scheduled and PRN oxycodone (high dose) at home. Will provide additional antiemetics. Pt tolerating PO, has not vomited in ED, and is  well appearing. I urged her to discuss her new pain with her pain clinic and will d/c home.  Final Clinical Impressions(s) / ED Diagnoses   Final diagnoses:  Chronic flank pain  Kidney stone    New Prescriptions New Prescriptions   PROMETHAZINE (PHENERGAN) 25 MG SUPPOSITORY    Place 1 suppository (25 mg total) rectally every 6 (six) hours as needed for refractory nausea / vomiting.     Shaune Pollack, MD 02/01/16 1256

## 2016-02-01 NOTE — ED Notes (Signed)
Wasted 1mg  of dilaudid in sharps container POD E Pyxis Room.  Witnessed by Vergia Alconasey H., RN

## 2016-02-01 NOTE — ED Notes (Signed)
Patient states that zofran did not help nausea and that her pain is still 10 out of 10.

## 2016-02-01 NOTE — ED Triage Notes (Signed)
Pt states she started having recurring back pain radiating to right flank; pt states hx of same; pt states she has had n/v; pt a&o 4 on arrival. Pt c/o pain at 9/10 on arrival.

## 2016-03-30 ENCOUNTER — Emergency Department (HOSPITAL_COMMUNITY): Payer: BLUE CROSS/BLUE SHIELD

## 2016-03-30 ENCOUNTER — Emergency Department (HOSPITAL_COMMUNITY)
Admission: EM | Admit: 2016-03-30 | Discharge: 2016-03-30 | Disposition: A | Discharge status: Home / Self Care | Payer: BLUE CROSS/BLUE SHIELD | Attending: Emergency Medicine | Admitting: Emergency Medicine

## 2016-03-30 ENCOUNTER — Encounter (HOSPITAL_COMMUNITY): Payer: Self-pay | Admitting: Emergency Medicine

## 2016-03-30 DIAGNOSIS — R109 Unspecified abdominal pain: Secondary | ICD-10-CM | POA: Diagnosis present

## 2016-03-30 DIAGNOSIS — N200 Calculus of kidney: Secondary | ICD-10-CM | POA: Diagnosis not present

## 2016-03-30 LAB — CBC WITH DIFFERENTIAL/PLATELET
BASOS ABS: 0 10*3/uL (ref 0.0–0.1)
BASOS PCT: 0 %
EOS ABS: 0.1 10*3/uL (ref 0.0–0.7)
EOS PCT: 1 %
HCT: 39.5 % (ref 36.0–46.0)
HEMOGLOBIN: 12.8 g/dL (ref 12.0–15.0)
Lymphocytes Relative: 13 %
Lymphs Abs: 1.3 10*3/uL (ref 0.7–4.0)
MCH: 24.6 pg — ABNORMAL LOW (ref 26.0–34.0)
MCHC: 32.4 g/dL (ref 30.0–36.0)
MCV: 75.8 fL — ABNORMAL LOW (ref 78.0–100.0)
Monocytes Absolute: 0.5 10*3/uL (ref 0.1–1.0)
Monocytes Relative: 5 %
NEUTROS PCT: 81 %
Neutro Abs: 8.2 10*3/uL — ABNORMAL HIGH (ref 1.7–7.7)
PLATELETS: 165 10*3/uL (ref 150–400)
RBC: 5.21 MIL/uL — ABNORMAL HIGH (ref 3.87–5.11)
RDW: 15 % (ref 11.5–15.5)
WBC: 10.1 10*3/uL (ref 4.0–10.5)

## 2016-03-30 LAB — COMPREHENSIVE METABOLIC PANEL
ALBUMIN: 4.1 g/dL (ref 3.5–5.0)
ALK PHOS: 90 U/L (ref 38–126)
ALT: 32 U/L (ref 14–54)
AST: 24 U/L (ref 15–41)
Anion gap: 5 (ref 5–15)
BUN: 12 mg/dL (ref 6–20)
CALCIUM: 9.2 mg/dL (ref 8.9–10.3)
CHLORIDE: 103 mmol/L (ref 101–111)
CO2: 28 mmol/L (ref 22–32)
CREATININE: 0.79 mg/dL (ref 0.44–1.00)
GFR calc Af Amer: >60 mL/min (ref >60)
GFR calc non Af Amer: >60 mL/min (ref >60)
GLUCOSE: 116 mg/dL — AB (ref 65–99)
Potassium: 3.6 mmol/L (ref 3.5–5.1)
SODIUM: 136 mmol/L (ref 135–145)
Total Bilirubin: 0.9 mg/dL (ref 0.3–1.2)
Total Protein: 8.1 g/dL (ref 6.5–8.1)

## 2016-03-30 LAB — URINALYSIS, ROUTINE W REFLEX MICROSCOPIC
Bilirubin Urine: NEGATIVE
Glucose, UA: NEGATIVE mg/dL
Hgb urine dipstick: NEGATIVE
Ketones, ur: NEGATIVE mg/dL
Leukocytes, UA: NEGATIVE
Nitrite: NEGATIVE
Protein, ur: NEGATIVE mg/dL
Specific Gravity, Urine: 1.001 — ABNORMAL LOW (ref 1.005–1.030)
pH: 7 (ref 5.0–8.0)

## 2016-03-30 LAB — I-STAT BETA HCG BLOOD, ED (MC, WL, AP ONLY): I-stat hCG, quantitative: <5 m[IU]/mL (ref <5)

## 2016-03-30 MED ORDER — SODIUM CHLORIDE 0.9 % IV BOLUS (SEPSIS)
1000.0000 mL | Freq: Once | INTRAVENOUS | Status: AC
  Administered 2016-03-30: 1000 mL via INTRAVENOUS

## 2016-03-30 MED ORDER — HYDROMORPHONE HCL 1 MG/ML IJ SOLN: 1.0000 mg | Freq: Once | INTRAMUSCULAR | Status: DC

## 2016-03-30 MED ORDER — METOCLOPRAMIDE HCL 5 MG/ML IJ SOLN
10.0000 mg | Freq: Once | INTRAMUSCULAR | Status: AC
  Administered 2016-03-30: 10 mg via INTRAVENOUS
  Filled 2016-03-30: qty 2

## 2016-03-30 MED ORDER — PROMETHAZINE HCL 25 MG PO TABS: 25.0000 mg | ORAL_TABLET | Freq: Three times a day (TID) | ORAL | 5 refills | Status: DC | PRN

## 2016-03-30 MED ORDER — PROMETHAZINE HCL 25 MG/ML IJ SOLN
25.0000 mg | Freq: Once | INTRAMUSCULAR | Status: AC
Start: 2016-03-30 — End: 2016-03-30
  Administered 2016-03-30: 25 mg via INTRAVENOUS
  Filled 2016-03-30: qty 1

## 2016-03-30 MED ORDER — HYDROMORPHONE HCL 1 MG/ML IJ SOLN
2.0000 mg | Freq: Once | INTRAMUSCULAR | Status: AC
  Administered 2016-03-30: 2 mg via INTRAVENOUS
  Filled 2016-03-30: qty 2

## 2016-03-30 MED ORDER — DIPHENHYDRAMINE HCL 50 MG/ML IJ SOLN
25.0000 mg | Freq: Once | INTRAMUSCULAR | Status: AC
  Administered 2016-03-30: 25 mg via INTRAVENOUS
  Filled 2016-03-30: qty 1

## 2016-03-30 MED ORDER — KETOROLAC TROMETHAMINE 30 MG/ML IJ SOLN
30.0000 mg | Freq: Once | INTRAMUSCULAR | Status: AC
  Administered 2016-03-30: 30 mg via INTRAVENOUS
  Filled 2016-03-30: qty 1

## 2016-03-30 MED ORDER — HYDROMORPHONE HCL 1 MG/ML IJ SOLN
1.0000 mg | Freq: Once | INTRAMUSCULAR | Status: AC
  Administered 2016-03-30: 1 mg via INTRAVENOUS
  Filled 2016-03-30: qty 1

## 2016-03-30 NOTE — ED Notes (Signed)
IV access discontinued.  Cath intact

## 2016-03-30 NOTE — ED Notes (Signed)
Pt ambulated to the BR with steady gait.  Pt made aware that she is discharged home, she reports she will call for her transport.  Pt ambulated to the BR with steady gait.

## 2016-03-30 NOTE — ED Notes (Signed)
Patient aware that we need urine. States she is unable to use restroom at this time

## 2016-03-30 NOTE — ED Triage Notes (Signed)
Patient comes from home via [facility] for n/v that started last night and right flank pain for kidney stone that patient has that is too big to pass and reports that she has been here several times for stone symptoms but hasnt had surgery to break it up.

## 2016-03-30 NOTE — ED Provider Notes (Signed)
[facility]-EMERGENCY DEPT Provider Note   CSN: [id number] Arrival date & time: 03/30/16  0725     History   Chief Complaint Chief Complaint  Patient presents with  . Emesis  . flank pain    HPI Mary Morse is a 36 y.o. female hx of UTI, previous kidney stones, chronic flank pain on oxycodone, Here with worsening right flank pain. Patient has been having intermittent right flank pain for months. Was seen in the ED about 2 months ago and had an ultrasound that showed 1.2 cm right intrarenal stone no Hydro. Patient has not seen urology in the meantime because she owes them some money. Patient states that for the last several days she's been vomiting as well as having worsening right flank pain. Denies any blood in the urine or fevers. Patient does have a chronic pain doctor and has been prescribed oxycodone.    The history is provided by the patient.    Past Medical History:  Diagnosis Date  . Acute pyelonephritis 12/2015; 01/07/2016  . Anxiety   . Borderline diabetes   . Chronic back pain    "sometimes all over" (01/07/2016)  . Complication of anesthesia    felt that the first surgery she had like she had a hard time waking up.  . Constipation   . GERD (gastroesophageal reflux disease)    "when I was pregnant; now only q once in awhile" (01/07/2016)  . History of kidney stones   . Migraine    "2-3 times/month" (01/07/2016)    Patient Active Problem List   Diagnosis Date Noted  . Hypokalemia 01/07/2016  . Obesity 01/07/2016  . Recurrent pyelonephritis   . History of nephrolithiasis   . Acute pyelonephritis 12/26/2015  . Migraine 12/26/2015  . Chronic back pain   . Gallstones 11/13/2012  . Renal calculus, right 11/11/2012    Past Surgical History:  Procedure Laterality Date  . ABDOMINAL HYSTERECTOMY  05/2011  . CHOLECYSTECTOMY N/A 12/06/2012   Procedure: LAPAROSCOPIC CHOLECYSTECTOMY WITH INTRAOPERATIVE CHOLANGIOGRAM;  Surgeon: Robyne Askew, MD;  Location: [facility] OR;   Service: General;  Laterality: N/A;  . LITHOTRIPSY  ~ 2010; 10/2012  . REDUCTION MAMMAPLASTY Bilateral 2013  . TUBAL LIGATION  2007    OB History    No data available       Home Medications    Prior to Admission medications   Medication Sig Start Date End Date Taking? Authorizing Provider  Ketorolac Tromethamine (SPRIX) 15.75 MG/SPRAY SOLN Place 1 spray into the nose daily as needed (congestion).   Yes Historical Provider, MD  Multiple Vitamin (MULTIVITAMIN WITH MINERALS) TABS tablet Take 1 tablet by mouth daily.   Yes Historical Provider, MD  ondansetron (ZOFRAN) 4 MG tablet Take 1 tablet (4 mg total) by mouth every 6 (six) hours. Patient taking differently: Take 4 mg by mouth every 8 (eight) hours as needed for nausea.  01/09/16  Yes Osvaldo Shipper, MD  Oxycodone HCl 10 MG TABS Take 10 mg by mouth 3 (three) times daily.   Yes Historical Provider, MD  promethazine (PHENERGAN) 25 MG suppository Place 1 suppository (25 mg total) rectally every 6 (six) hours as needed for refractory nausea / vomiting. 02/01/16  Yes Shaune Pollack, MD  promethazine (PHENERGAN) 25 MG tablet Take 25 mg by mouth every 8 (eight) hours as needed for nausea/vomiting. 01/09/16  Yes Historical Provider, MD  cefpodoxime (VANTIN) 200 MG tablet Take 1 tablet (200 mg total) by mouth every 12 (twelve) hours. For 13 more days  Patient not taking: Reported on 02/01/2016 01/09/16   Osvaldo ShipperGokul Krishnan, MD  oxyCODONE-acetaminophen (PERCOCET/ROXICET) 5-325 MG tablet Take 1-2 tablets by mouth every 6 (six) hours as needed. Patient not taking: Reported on 02/01/2016 12/30/15   Marlon Peliffany Greene, PA-C    Family History Family History  Problem Relation Age of Onset  . Cancer Father     prostate  . Cancer Paternal Grandmother     breast    Social History Social History  Substance Use Topics  . Smoking status: Never Smoker  . Smokeless tobacco: Never Used  . Alcohol use No     Allergies   Patient has no known  allergies.   Review of Systems Review of Systems  Gastrointestinal: Positive for abdominal pain and vomiting.  All other systems reviewed and are negative.    Physical Exam Updated Vital Signs BP 94/75   Pulse (!) 54   Temp 98.3 F (36.8 C) (Oral)   Resp 18   Wt 200 lb (90.7 kg)   SpO2 100%   BMI 37.79 kg/m   Physical Exam  Constitutional: She is oriented to person, place, and time.  Uncomfortable   HENT:  Head: Normocephalic.  Mouth/Throat: Oropharynx is clear and moist.  Eyes: EOM are normal. Pupils are equal, round, and reactive to light.  Neck: Normal range of motion. Neck supple.  Cardiovascular: Normal rate, regular rhythm and normal heart sounds.   Pulmonary/Chest: Effort normal and breath sounds normal. No respiratory distress. She has no wheezes. She has no rales.  Abdominal: Soft. Bowel sounds are normal.  Mild R CVAT. No lower abdominal tenderness, no RUQ tenderness   Musculoskeletal: Normal range of motion.  Neurological: She is alert and oriented to person, place, and time.  Skin: Skin is warm.  Psychiatric: She has a normal mood and affect.  Nursing note and vitals reviewed.    ED Treatments / Results  Labs (all labs ordered are listed, but only abnormal results are displayed) Labs Reviewed  CBC WITH DIFFERENTIAL/PLATELET - Abnormal; Notable for the following:       Result Value   RBC 5.21 (*)    MCV 75.8 (*)    MCH 24.6 (*)    Neutro Abs 8.2 (*)    All other components within normal limits  COMPREHENSIVE METABOLIC PANEL - Abnormal; Notable for the following:    Glucose, Bld 116 (*)    All other components within normal limits  URINALYSIS, ROUTINE W REFLEX MICROSCOPIC - Abnormal; Notable for the following:    Color, Urine COLORLESS (*)    Specific Gravity, Urine 1.001 (*)    All other components within normal limits  URINE CULTURE  I-STAT BETA HCG BLOOD, ED ([facility], [facility], [facility] ONLY)    EKG  EKG Interpretation None       Radiology [place]  Renal  Result Date: 03/30/2016 CLINICAL DATA:  Right-sided flank pain EXAM: RENAL / URINARY TRACT ULTRASOUND COMPLETE COMPARISON:  02/01/2016 FINDINGS: Right Kidney: Length: 11.8 cm. Mild hydronephrosis is noted. Renal calculi are seen the largest of which lies in the lower pole measuring 8 mm. Left Kidney: Length: 11.4 cm. Echogenicity within normal limits. No mass or hydronephrosis visualized. Bladder: Appears normal for degree of bladder distention. IMPRESSION: Right renal calculi and mild hydronephrosis. A partially obstructing ureteral stone cannot be excluded on this exam. Electronically Signed   By: Alcide CleverMark  Lukens M.D.   On: 03/30/2016 08:19   Ct Renal Stone Study  Result Date: 03/30/2016 CLINICAL DATA:  Right flank pain.  History renal stones. EXAM: CT ABDOMEN AND PELVIS WITHOUT CONTRAST TECHNIQUE: Multidetector CT imaging of the abdomen and pelvis was performed following the standard protocol without IV contrast. COMPARISON:  01/10/2016 FINDINGS: Lower chest: Lung bases are clear. No effusions. Heart is normal size. Hepatobiliary: Prior cholecystectomy.  No focal hepatic abnormality. Pancreas: No focal abnormality or ductal dilatation. Spleen: No focal abnormality.  Normal size. Adrenals/Urinary Tract: Again noted is moderate right hydronephrosis and edema within the renal appear minutes on the right, similar to prior study. The right ureters decompressed. This could be related to pyelonephritis. Nonobstructing stones in the lower pole of the right kidney are unchanged. No renal or ureteral stones on the left. Adrenal glands and urinary bladder are unremarkable. Stomach/Bowel: Appendix is normal. Stomach, large and small bowel grossly unremarkable. Vascular/Lymphatic: No evidence of aneurysm or adenopathy. Reproductive: Prior hysterectomy.  No adnexal mass. Other: Trace free fluid in the pelvis.  No free air. Musculoskeletal: No acute bony abnormality. IMPRESSION: Stable mild right hydronephrosis with  nonobstructing stones in the lower pole of the right kidney. The right ureter is not dilated and there are no ureteral stones. Findings could reflect pyelonephritis. Prior cholecystectomy. Electronically Signed   By: Charlett Nose M.D.   On: 03/30/2016 09:19    Procedures Procedures (including critical care time)  Medications Ordered in ED Medications  sodium chloride 0.9 % bolus 1,000 mL (0 mLs Intravenous Stopped 03/30/16 1214)  promethazine (PHENERGAN) injection 25 mg (25 mg Intravenous Given 03/30/16 0825)  HYDROmorphone (DILAUDID) injection 1 mg (1 mg Intravenous Given 03/30/16 0825)  ketorolac (TORADOL) 30 MG/ML injection 30 mg (30 mg Intravenous Given 03/30/16 1013)  metoCLOPramide (REGLAN) injection 10 mg (10 mg Intravenous Given 03/30/16 1047)  diphenhydrAMINE (BENADRYL) injection 25 mg (25 mg Intravenous Given 03/30/16 1046)  HYDROmorphone (DILAUDID) injection 2 mg (2 mg Intravenous Given 03/30/16 1048)  sodium chloride 0.9 % bolus 1,000 mL (1,000 mLs Intravenous New Bag/Given 03/30/16 1215)     Initial Impression / Assessment and Plan / ED Course  I have reviewed the triage vital signs and the nursing notes.  Pertinent labs & imaging results that were available during my care of the patient were reviewed by me and considered in my medical decision making (see chart for details).    Mary Morse is a 36 y.o. female here with R flank pain. Hx of intrarenal stone. Also has chronic R flank pain. Will repeat labs, UA, US renal. If there is no hydro, will not need emergent procedure. Will give pain meds. She has a pain doctor so if the US showed no hydro, will not send home with pain meds.   1:07 PM Labs unremarkable. UA showed no blood or UTI. [place] initially showed mild hydro with 8 mm intrarenal stone. CT showed stable hydro from previous and no lower ureteral stone. Pain controlled. BP dropped with dilaudid but came up with NS bolus. Told her to call pain doctor regarding changing her pain medicine  prescriptions. Will have her follow up with [facility]    Final Clinical Impressions(s) / ED Diagnoses   Final diagnoses:  None    New Prescriptions New Prescriptions   No medications on file     Charlynne Pander, MD 03/30/16 1308

## 2016-03-30 NOTE — ED Notes (Signed)
Bed: WA17 Expected date:  Expected time:  Means of arrival:  Comments: [id number] nausea vomiting right flank pain

## 2016-03-30 NOTE — ED Notes (Signed)
Patient attempted to use restroom, was unable to do so

## 2016-03-30 NOTE — Discharge Instructions (Signed)
Continue motrin for pain.   Take phenergan for nausea.   Take percocet as prescribed by your pain doctor   Call your pain doctor regarding your percocet prescription.   See [facility]   Return to ER if you have worse abdominal pain, vomiting, fevers.

## 2016-03-31 LAB — URINE CULTURE

## 2016-04-05 ENCOUNTER — Encounter (HOSPITAL_COMMUNITY): Payer: Self-pay

## 2016-04-05 DIAGNOSIS — G43909 Migraine, unspecified, not intractable, without status migrainosus: Secondary | ICD-10-CM | POA: Diagnosis present

## 2016-04-05 DIAGNOSIS — Z5321 Procedure and treatment not carried out due to patient leaving prior to being seen by health care provider: Secondary | ICD-10-CM | POA: Insufficient documentation

## 2016-04-05 NOTE — ED Notes (Signed)
Called name, but no response from the waiting room. 

## 2016-04-05 NOTE — ED Triage Notes (Addendum)
Pt presents with [facility] from home c/o migraine that started 2 hours ago. Took some "Sprris" nasal spray for pain without relief. Pt has a hx of same. Endorses N/V/ and photosensitivity which she states is normal for her migraines.

## 2016-04-06 ENCOUNTER — Emergency Department (HOSPITAL_COMMUNITY)
Admission: EM | Admit: 2016-04-06 | Discharge: 2016-04-06 | Disposition: A | Discharge status: Home / Self Care | Payer: BLUE CROSS/BLUE SHIELD | Attending: Dermatology | Admitting: Dermatology

## 2016-04-06 NOTE — ED Notes (Signed)
Pt called for room no answer

## 2016-04-06 NOTE — ED Notes (Signed)
Called for room no answer

## 2016-04-23 ENCOUNTER — Encounter (HOSPITAL_COMMUNITY): Payer: Self-pay | Admitting: Emergency Medicine

## 2016-04-23 ENCOUNTER — Emergency Department (HOSPITAL_COMMUNITY)
Admission: EM | Admit: 2016-04-23 | Discharge: 2016-04-23 | Disposition: A | Discharge status: Home / Self Care | Payer: BLUE CROSS/BLUE SHIELD | Attending: Emergency Medicine | Admitting: Emergency Medicine

## 2016-04-23 DIAGNOSIS — Z79899 Other long term (current) drug therapy: Secondary | ICD-10-CM | POA: Insufficient documentation

## 2016-04-23 DIAGNOSIS — G8929 Other chronic pain: Secondary | ICD-10-CM | POA: Insufficient documentation

## 2016-04-23 DIAGNOSIS — Z87442 Personal history of urinary calculi: Secondary | ICD-10-CM | POA: Diagnosis not present

## 2016-04-23 DIAGNOSIS — R109 Unspecified abdominal pain: Secondary | ICD-10-CM | POA: Diagnosis not present

## 2016-04-23 LAB — CBC WITH DIFFERENTIAL/PLATELET
Basophils Absolute: 0 10*3/uL (ref 0.0–0.1)
Basophils Relative: 0 %
Eosinophils Absolute: 0 10*3/uL (ref 0.0–0.7)
Eosinophils Relative: 0 %
HEMATOCRIT: 37.6 % (ref 36.0–46.0)
HEMOGLOBIN: 12.2 g/dL (ref 12.0–15.0)
LYMPHS ABS: 1.5 10*3/uL (ref 0.7–4.0)
LYMPHS PCT: 15 %
MCH: 24.9 pg — AB (ref 26.0–34.0)
MCHC: 32.4 g/dL (ref 30.0–36.0)
MCV: 76.9 fL — AB (ref 78.0–100.0)
MONOS PCT: 4 %
Monocytes Absolute: 0.4 10*3/uL (ref 0.1–1.0)
NEUTROS ABS: 8.7 10*3/uL — AB (ref 1.7–7.7)
NEUTROS PCT: 81 %
Platelets: 158 10*3/uL (ref 150–400)
RBC: 4.89 MIL/uL (ref 3.87–5.11)
RDW: 15.3 % (ref 11.5–15.5)
WBC: 10.6 10*3/uL — ABNORMAL HIGH (ref 4.0–10.5)

## 2016-04-23 LAB — URINALYSIS, ROUTINE W REFLEX MICROSCOPIC
Bilirubin Urine: NEGATIVE
Glucose, UA: NEGATIVE mg/dL
Hgb urine dipstick: NEGATIVE
Ketones, ur: NEGATIVE mg/dL
Leukocytes, UA: NEGATIVE
NITRITE: NEGATIVE
PH: 6 (ref 5.0–8.0)
Protein, ur: NEGATIVE mg/dL
SPECIFIC GRAVITY, URINE: 1.02 (ref 1.005–1.030)

## 2016-04-23 LAB — BASIC METABOLIC PANEL
Anion gap: 9 (ref 5–15)
BUN: 9 mg/dL (ref 6–20)
CHLORIDE: 102 mmol/L (ref 101–111)
CO2: 27 mmol/L (ref 22–32)
CREATININE: 0.81 mg/dL (ref 0.44–1.00)
Calcium: 9.2 mg/dL (ref 8.9–10.3)
GFR calc Af Amer: >60 mL/min (ref >60)
GFR calc non Af Amer: >60 mL/min (ref >60)
Glucose, Bld: 123 mg/dL — ABNORMAL HIGH (ref 65–99)
Potassium: 3.6 mmol/L (ref 3.5–5.1)
Sodium: 138 mmol/L (ref 135–145)

## 2016-04-23 MED ORDER — KETOROLAC TROMETHAMINE 60 MG/2ML IM SOLN
60.0000 mg | Freq: Once | INTRAMUSCULAR | Status: AC
  Administered 2016-04-23: 60 mg via INTRAMUSCULAR
  Filled 2016-04-23: qty 2

## 2016-04-23 MED ORDER — PROMETHAZINE HCL 25 MG PO TABS
25.0000 mg | ORAL_TABLET | Freq: Once | ORAL | Status: AC
  Administered 2016-04-23: 25 mg via ORAL
  Filled 2016-04-23: qty 1

## 2016-04-23 MED ORDER — METOCLOPRAMIDE HCL 10 MG PO TABS
10.0000 mg | ORAL_TABLET | Freq: Once | ORAL | Status: DC
  Filled 2016-04-23: qty 1

## 2016-04-23 NOTE — ED Notes (Signed)
Patient requesting IV fluids. States she won't likely be able to void without them. MD [facility] informed.

## 2016-04-23 NOTE — ED Notes (Signed)
Patient refused medications which were ordered by MD.

## 2016-04-23 NOTE — ED Provider Notes (Signed)
[facility]-EMERGENCY DEPT Provider Note   CSN: [id number] Arrival date & time: 04/23/16  0310  By signing my name below, I, Mary Morse, attest that this documentation has been prepared under the direction and in the presence of Tomasita Crumble, MD.  Electronically Signed: Octavia Morse, ED Scribe. 04/23/16. 3:44 AM.    History   Chief Complaint Chief Complaint  Patient presents with  . Flank Pain  . Abdominal Pain    The history is provided by the patient. No language interpreter was used.   HPI Comments: Mary Morse is a 36 y.o. female who has a PMhx of chronic flank pain, GERD, hypokalemia presents to the Emergency Department by [facility] complaining of recurrent, acute-on chronic, right flank pain that radiates into her abdomen x few hours. She reports associated vomiting secondary to pain. She has been having this on going problem for the past 3 years and has been evaluated multiple times for the same complaint. Pt has taken Zofran but no other pain medication to alleviate her pain. Laying down modifies her pain. Pt denies urinary symptoms, vaginal bleeding, or vaginal discharge.  Past Medical History:  Diagnosis Date  . Acute pyelonephritis 12/2015; 01/07/2016  . Anxiety   . Borderline diabetes   . Chronic back pain    "sometimes all over" (01/07/2016)  . Complication of anesthesia    felt that the first surgery she had like she had a hard time waking up.  . Constipation   . GERD (gastroesophageal reflux disease)    "when I was pregnant; now only q once in awhile" (01/07/2016)  . History of kidney stones   . Migraine    "2-3 times/month" (01/07/2016)    Patient Active Problem List   Diagnosis Date Noted  . Hypokalemia 01/07/2016  . Obesity 01/07/2016  . Recurrent pyelonephritis   . History of nephrolithiasis   . Acute pyelonephritis 12/26/2015  . Migraine 12/26/2015  . Chronic back pain   . Gallstones 11/13/2012  . Renal calculus, right 11/11/2012    Past Surgical  History:  Procedure Laterality Date  . ABDOMINAL HYSTERECTOMY  05/2011  . CHOLECYSTECTOMY N/A 12/06/2012   Procedure: LAPAROSCOPIC CHOLECYSTECTOMY WITH INTRAOPERATIVE CHOLANGIOGRAM;  Surgeon: Robyne Askew, MD;  Location: [facility] OR;  Service: General;  Laterality: N/A;  . LITHOTRIPSY  ~ 2010; 10/2012  . REDUCTION MAMMAPLASTY Bilateral 2013  . TUBAL LIGATION  2007    OB History    No data available       Home Medications    Prior to Admission medications   Medication Sig Start Date End Date Taking? Authorizing Provider  Ketorolac Tromethamine (SPRIX) 15.75 MG/SPRAY SOLN Place 1 spray into the nose daily as needed (congestion).   Yes Historical Provider, MD  Multiple Vitamin (MULTIVITAMIN WITH MINERALS) TABS tablet Take 1 tablet by mouth daily.   Yes Historical Provider, MD  ondansetron (ZOFRAN) 4 MG tablet Take 1 tablet (4 mg total) by mouth every 6 (six) hours. Patient taking differently: Take 4 mg by mouth every 8 (eight) hours as needed for nausea.  01/09/16  Yes Osvaldo Shipper, MD  Oxycodone HCl 10 MG TABS Take 10 mg by mouth 3 (three) times daily.   Yes Historical Provider, MD  promethazine (PHENERGAN) 25 MG suppository Place 1 suppository (25 mg total) rectally every 6 (six) hours as needed for refractory nausea / vomiting. 02/01/16  Yes Shaune Pollack, MD  promethazine (PHENERGAN) 25 MG tablet Take 1 tablet (25 mg total) by mouth every 8 (eight) hours as  needed. Patient taking differently: Take 25 mg by mouth every 8 (eight) hours as needed for nausea or vomiting.  03/30/16  Yes Charlynne Pander, MD    Family History Family History  Problem Relation Age of Onset  . Cancer Father     prostate  . Cancer Paternal Grandmother     breast    Social History Social History  Substance Use Topics  . Smoking status: Never Smoker  . Smokeless tobacco: Never Used  . Alcohol use No     Allergies   Patient has no known allergies.   Review of Systems Review of Systems  A  complete 10 system review of systems was obtained and all systems are negative except as noted in the HPI and PMH.   Physical Exam Updated Vital Signs BP 109/72   Pulse 64   Temp 97.8 F (36.6 C) (Oral)   Resp 17   Ht  (1.549 m)   Wt 200 lb (90.7 kg)   SpO2 100%   BMI 37.79 kg/m   Physical Exam  Constitutional: She is oriented to person, place, and time. She appears well-developed and well-nourished. No distress.  HENT:  Head: Normocephalic and atraumatic.  Nose: Nose normal.  Mouth/Throat: Oropharynx is clear and moist. No oropharyngeal exudate.  Eyes: Conjunctivae and EOM are normal. Pupils are equal, round, and reactive to light. No scleral icterus.  Neck: Normal range of motion. Neck supple. No JVD present. No tracheal deviation present. No thyromegaly present.  Cardiovascular: Normal rate, regular rhythm and normal heart sounds.  Exam reveals no gallop and no friction rub.   No murmur heard. Pulmonary/Chest: Effort normal and breath sounds normal. No respiratory distress. She has no wheezes. She exhibits no tenderness.  Abdominal: Soft. Bowel sounds are normal. She exhibits no distension and no mass. There is no tenderness. There is no rebound and no guarding.  Musculoskeletal: Normal range of motion. She exhibits no edema or tenderness.  Lymphadenopathy:    She has no cervical adenopathy.  Neurological: She is alert and oriented to person, place, and time. No cranial nerve deficit. She exhibits normal muscle tone.  Skin: Skin is warm and dry. No rash noted. No erythema. No pallor.  Nursing note and vitals reviewed.    ED Treatments / Results  DIAGNOSTIC STUDIES: Oxygen Saturation is 99% on RA, normal by my interpretation.  COORDINATION OF CARE:  3:42 AM Discussed treatment plan with pt at bedside and pt agreed to plan.  Labs (all labs ordered are listed, but only abnormal results are displayed) Labs Reviewed  CBC WITH DIFFERENTIAL/PLATELET - Abnormal; Notable  for the following:       Result Value   WBC 10.6 (*)    MCV 76.9 (*)    MCH 24.9 (*)    Neutro Abs 8.7 (*)    All other components within normal limits  BASIC METABOLIC PANEL - Abnormal; Notable for the following:    Glucose, Bld 123 (*)    All other components within normal limits  URINE CULTURE  URINALYSIS, ROUTINE W REFLEX MICROSCOPIC    EKG  EKG Interpretation None       Radiology No results found.  Procedures Procedures (including critical care time)  Medications Ordered in ED Medications  metoCLOPramide (REGLAN) tablet 10 mg (10 mg Oral Refused 04/23/16 0350)  ketorolac (TORADOL) injection 60 mg (60 mg Intramuscular Given 04/23/16 0458)  promethazine (PHENERGAN) tablet 25 mg (25 mg Oral Given 04/23/16 0457)     Initial  Impression / Assessment and Plan / ED Course  I have reviewed the triage vital signs and the nursing notes.  Pertinent labs & imaging results that were available during my care of the patient were reviewed by me and considered in my medical decision making (see chart for details).       FPatient presents to emergency department for acute on chronic flank pain. Looking back in her chart, all of her CT scans revealed kidney stones inside the kidney, nonobstructive out. Creatinine today is normal, urinalysis is completely clear. She was given Toradol and Phenergan for her symptoms. She is advised to follow with primary care doctor for chronic flank pain. She appears well and in no acute distress, vital signs within her normal limits and she is safe for discharge.inal Clinical Impressions(s) / ED Diagnoses   Final diagnoses:  Chronic flank pain   I personally performed the services described in this documentation, which was scribed in my presence. The recorded information has been reviewed and is accurate.    New Prescriptions New Prescriptions   No medications on file     Tomasita Crumble, MD 04/23/16 [phone]

## 2016-04-23 NOTE — ED Triage Notes (Signed)
Patient arrives from home via [facility] with complaint of Right flank pain with radiation to abdomen. History of chronic flank pain with known renal stones. Ambulated into the department with [facility] personnel.

## 2016-04-23 NOTE — ED Notes (Signed)
Entered room to provide discharge instruction and follow-up recommendations. Patient not in room. Per [facility] personnel, patient eloped.

## 2016-04-23 NOTE — ED Notes (Signed)
Pt leaving the department, pt states she is unwilling to stay for d/c instructions.

## 2016-04-23 NOTE — ED Notes (Signed)
MD [facility] informed of patient refusal of medications.

## 2016-04-23 NOTE — ED Notes (Signed)
Patient explains that current bout of flank pain started last evening around 2300. Prior to that she had 2 weeks without flank pain. States that today she had onset of nausea and vomiting too.

## 2016-04-25 LAB — URINE CULTURE

## 2016-05-24 ENCOUNTER — Encounter (HOSPITAL_COMMUNITY): Payer: Self-pay | Admitting: Emergency Medicine

## 2016-05-24 ENCOUNTER — Emergency Department (HOSPITAL_COMMUNITY): Payer: BLUE CROSS/BLUE SHIELD

## 2016-05-24 ENCOUNTER — Emergency Department (HOSPITAL_COMMUNITY)
Admission: EM | Admit: 2016-05-24 | Discharge: 2016-05-24 | Disposition: A | Discharge status: Home / Self Care | Payer: BLUE CROSS/BLUE SHIELD | Attending: Emergency Medicine | Admitting: Emergency Medicine

## 2016-05-24 DIAGNOSIS — R109 Unspecified abdominal pain: Secondary | ICD-10-CM | POA: Insufficient documentation

## 2016-05-24 DIAGNOSIS — R112 Nausea with vomiting, unspecified: Secondary | ICD-10-CM | POA: Diagnosis present

## 2016-05-24 LAB — COMPREHENSIVE METABOLIC PANEL
ALK PHOS: 58 U/L (ref 38–126)
ALT: 21 U/L (ref 14–54)
ANION GAP: 8 (ref 5–15)
AST: 18 U/L (ref 15–41)
Albumin: 3.9 g/dL (ref 3.5–5.0)
BUN: 8 mg/dL (ref 6–20)
CALCIUM: 8.9 mg/dL (ref 8.9–10.3)
CHLORIDE: 106 mmol/L (ref 101–111)
CO2: 27 mmol/L (ref 22–32)
CREATININE: 0.7 mg/dL (ref 0.44–1.00)
Glucose, Bld: 121 mg/dL — ABNORMAL HIGH (ref 65–99)
Potassium: 3 mmol/L — ABNORMAL LOW (ref 3.5–5.1)
SODIUM: 141 mmol/L (ref 135–145)
Total Bilirubin: 0.6 mg/dL (ref 0.3–1.2)
Total Protein: 7.1 g/dL (ref 6.5–8.1)

## 2016-05-24 LAB — CBC WITH DIFFERENTIAL/PLATELET
BASOS PCT: 0 %
Basophils Absolute: 0 10*3/uL (ref 0.0–0.1)
Eosinophils Absolute: 0.1 10*3/uL (ref 0.0–0.7)
Eosinophils Relative: 1 %
HCT: 36 % (ref 36.0–46.0)
HEMOGLOBIN: 11.5 g/dL — AB (ref 12.0–15.0)
Lymphocytes Relative: 20 %
Lymphs Abs: 1.9 10*3/uL (ref 0.7–4.0)
MCH: 24.7 pg — AB (ref 26.0–34.0)
MCHC: 31.9 g/dL (ref 30.0–36.0)
MCV: 77.3 fL — AB (ref 78.0–100.0)
MONO ABS: 0.6 10*3/uL (ref 0.1–1.0)
Monocytes Relative: 6 %
NEUTROS ABS: 6.9 10*3/uL (ref 1.7–7.7)
NEUTROS PCT: 73 %
PLATELETS: 136 10*3/uL — AB (ref 150–400)
RBC: 4.66 MIL/uL (ref 3.87–5.11)
RDW: 15.1 % (ref 11.5–15.5)
WBC: 9.4 10*3/uL (ref 4.0–10.5)

## 2016-05-24 LAB — I-STAT BETA HCG BLOOD, ED (MC, WL, AP ONLY)

## 2016-05-24 MED ORDER — CEPHALEXIN 500 MG PO CAPS: 500.0000 mg | ORAL_CAPSULE | Freq: Two times a day (BID) | ORAL | 0 refills | Status: DC

## 2016-05-24 MED ORDER — POTASSIUM CHLORIDE CRYS ER 20 MEQ PO TBCR
40.0000 meq | EXTENDED_RELEASE_TABLET | Freq: Once | ORAL | Status: AC
Start: 2016-05-24 — End: 2016-05-24
  Administered 2016-05-24: 40 meq via ORAL
  Filled 2016-05-24: qty 2

## 2016-05-24 MED ORDER — ONDANSETRON HCL 4 MG/2ML IJ SOLN
4.0000 mg | Freq: Once | INTRAMUSCULAR | Status: AC
  Administered 2016-05-24: 4 mg via INTRAVENOUS
  Filled 2016-05-24: qty 2

## 2016-05-24 MED ORDER — PROMETHAZINE HCL 25 MG PO TABS
25.0000 mg | ORAL_TABLET | Freq: Once | ORAL | Status: AC
  Administered 2016-05-24: 25 mg via ORAL
  Filled 2016-05-24: qty 1

## 2016-05-24 MED ORDER — KETOROLAC TROMETHAMINE 15 MG/ML IJ SOLN
15.0000 mg | Freq: Once | INTRAMUSCULAR | Status: AC
  Administered 2016-05-24: 15 mg via INTRAVENOUS
  Filled 2016-05-24: qty 1

## 2016-05-24 MED ORDER — PROMETHAZINE HCL 12.5 MG PO TABS: 12.5000 mg | ORAL_TABLET | Freq: Four times a day (QID) | ORAL | 0 refills | Status: DC | PRN

## 2016-05-24 MED ORDER — MORPHINE SULFATE (PF) 4 MG/ML IV SOLN
4.0000 mg | Freq: Once | INTRAVENOUS | Status: AC
  Administered 2016-05-24: 4 mg via INTRAVENOUS
  Filled 2016-05-24: qty 1

## 2016-05-24 MED ORDER — FENTANYL CITRATE (PF) 100 MCG/2ML IJ SOLN
50.0000 ug | Freq: Once | INTRAMUSCULAR | Status: AC
  Administered 2016-05-24: 50 ug via INTRAVENOUS
  Filled 2016-05-24: qty 2

## 2016-05-24 MED ORDER — NAPROXEN 375 MG PO TABS: 375.0000 mg | ORAL_TABLET | Freq: Two times a day (BID) | ORAL | 0 refills | Status: DC

## 2016-05-24 NOTE — ED Notes (Signed)
Pt. Made aware for the need of urine. 

## 2016-05-24 NOTE — ED Provider Notes (Signed)
[facility]-EMERGENCY DEPT Provider Note   CSN: [id number] Arrival date & time: 05/24/16  0023     History   Chief Complaint Chief Complaint  Patient presents with  . Flank Pain    HPI Mary Morse is a 36 y.o. female with a hx of kidney stones presents to the Emergency Department complaining of gradual, persistent, progressively worsening right flank pain onset 2 days ago. Associated symptoms include chills, nausea and vomiting.  Pt reports she is not taking anything for pain.  She reports taking Zofran.  She reports an Rx for phenergan, but she never filled this medication.  Pt reports no relief from the zofran.  No additional treatments tried.  Nothing makes it better and nothing makes it worse.  Pt denies fever, CP, SOB, diarrhea, weakness, dizziness, syncope, dysuria, hematuria.       The history is provided by the patient and medical records.    Past Medical History:  Diagnosis Date  . Acute pyelonephritis 12/2015; 01/07/2016  . Anxiety   . Borderline diabetes   . Chronic back pain    "sometimes all over" (01/07/2016)  . Complication of anesthesia    felt that the first surgery she had like she had a hard time waking up.  . Constipation   . GERD (gastroesophageal reflux disease)    "when I was pregnant; now only q once in awhile" (01/07/2016)  . History of kidney stones   . Migraine    "2-3 times/month" (01/07/2016)    Patient Active Problem List   Diagnosis Date Noted  . Hypokalemia 01/07/2016  . Obesity 01/07/2016  . Recurrent pyelonephritis   . History of nephrolithiasis   . Acute pyelonephritis 12/26/2015  . Migraine 12/26/2015  . Chronic back pain   . Gallstones 11/13/2012  . Renal calculus, right 11/11/2012    Past Surgical History:  Procedure Laterality Date  . ABDOMINAL HYSTERECTOMY  05/2011  . CHOLECYSTECTOMY N/A 12/06/2012   Procedure: LAPAROSCOPIC CHOLECYSTECTOMY WITH INTRAOPERATIVE CHOLANGIOGRAM;  Surgeon: Robyne Askew, MD;  Location: [facility] OR;   Service: General;  Laterality: N/A;  . LITHOTRIPSY  ~ 2010; 10/2012  . REDUCTION MAMMAPLASTY Bilateral 2013  . TUBAL LIGATION  2007    OB History    No data available       Home Medications    Prior to Admission medications   Not on File    Family History Family History  Problem Relation Age of Onset  . Cancer Father     prostate  . Cancer Paternal Grandmother     breast    Social History Social History  Substance Use Topics  . Smoking status: Never Smoker  . Smokeless tobacco: Never Used  . Alcohol use No     Allergies   Patient has no known allergies.   Review of Systems Review of Systems  Constitutional: Positive for chills. Negative for fever.  Respiratory: Negative for shortness of breath.   Cardiovascular: Negative for chest pain.  Gastrointestinal: Positive for nausea and vomiting. Negative for abdominal pain.  Endocrine: Negative for polydipsia, polyphagia and polyuria.  Genitourinary: Positive for flank pain.  Musculoskeletal: Positive for back pain ( chronic).  Skin: Negative for rash.  Neurological: Negative for dizziness and syncope.  All other systems reviewed and are negative.    Physical Exam Updated Vital Signs Pulse (!) 115   Temp 97.6 F (36.4 C) (Oral)   Resp 16   Ht  (1.549 m)   Wt 88.5 kg   SpO2  99%   BMI 36.84 kg/m   Physical Exam  Constitutional: She appears well-developed and well-nourished. No distress.  Actively vomiting  HENT:  Head: Normocephalic and atraumatic.  Mouth/Throat: Oropharynx is clear and moist. No oropharyngeal exudate.  Eyes: Conjunctivae are normal. No scleral icterus.  Neck: Normal range of motion. Neck supple.  Cardiovascular: Regular rhythm, normal heart sounds and intact distal pulses.  Tachycardia present.   Pulses:      Radial pulses are 2+ on the right side, and 2+ on the left side.       Dorsalis pedis pulses are 2+ on the right side, and 2+ on the left side.  Pulmonary/Chest: Effort  normal and breath sounds normal. No respiratory distress. She has no wheezes.  Equal chest expansion  Abdominal: Soft. Bowel sounds are normal. She exhibits no distension and no mass. There is no tenderness. There is CVA tenderness (left). There is no rebound and no guarding.  Musculoskeletal: Normal range of motion. She exhibits no edema.  Neurological: She is alert.  Speech is clear and goal oriented Moves extremities without ataxia  Skin: Skin is warm and dry. No rash noted. She is not diaphoretic.  Psychiatric: She has a normal mood and affect.  Nursing note and vitals reviewed.    ED Treatments / Results  Labs (all labs ordered are listed, but only abnormal results are displayed) Labs Reviewed  CBC WITH DIFFERENTIAL/PLATELET - Abnormal; Notable for the following:       Result Value   Hemoglobin 11.5 (*)    MCV 77.3 (*)    MCH 24.7 (*)    Platelets 136 (*)    All other components within normal limits  COMPREHENSIVE METABOLIC PANEL - Abnormal; Notable for the following:    Potassium 3.0 (*)    Glucose, Bld 121 (*)    All other components within normal limits  URINALYSIS, ROUTINE W REFLEX MICROSCOPIC  I-STAT BETA HCG BLOOD, ED ([facility], [facility], [facility] ONLY)    Procedures Procedures (including critical care time)  Medications Ordered in ED Medications  fentaNYL (SUBLIMAZE) injection 50 mcg (50 mcg Intravenous Given 05/24/16 0313)  ondansetron (ZOFRAN) injection 4 mg (4 mg Intravenous Given 05/24/16 0312)  potassium chloride SA (K-DUR,KLOR-CON) CR tablet 40 mEq (40 mEq Oral Given 05/24/16 0312)  morphine 4 MG/ML injection 4 mg (4 mg Intravenous Given 05/24/16 0413)  promethazine (PHENERGAN) tablet 25 mg (25 mg Oral Given 05/24/16 0413)     Initial Impression / Assessment and Plan / ED Course  I have reviewed the triage vital signs and the nursing notes.  Pertinent labs & imaging results that were available during my care of the patient were reviewed by me and considered in my medical  decision making (see chart for details).  Clinical Course as of May 25 606  Tue May 24, 2016  0229 Outside reconciliation of meds shows that pt is taking Oxycocdone  with refill of 120 tabs in Feb, March and April.  Pt also with hydrocodone dispensed in April.    [HM]  0231 [place] narcotic database shows Hysingla  tablet - 30 tabs on 05/06/16; Oxycodone  tablet - 120 on 04/26/16, 04/01/16, 03/07/16 along with numerous additional prescriptions in Nov, December and January  [HM]    Clinical Course User Index [HM] Dierdre Forth, PA-C    Patient presents with left flank pain. She has a history of chronic flank pain and numerous kidney stones. Pain controlled here in the emergency department along with nausea. No persistent vomiting.  Plan for discharge home with urology follow-up for further evaluation and treatment.  6:08 AM At shift change, CT renal continues to pend.  Care transferred to [facility], PA-C who will following imaging, reassess, PO trial and disposition.    Final Clinical Impressions(s) / ED Diagnoses   Final diagnoses:  Left flank pain    New Prescriptions Current Discharge Medication List       Dierdre Forth, PA-C 05/24/16 2440    Geoffery Lyons, MD 05/24/16 [phone]

## 2016-05-24 NOTE — ED Provider Notes (Signed)
The patient receives and care hand off from PA Muthersbaugh pending CT renal results. Please see her full note for history and physical. In short patient presents with left flank pain has history of chronic flank pain and numerous kidney stones. Pain has been controlled in the ED she was resting on reexamination. CT renal returns with no obstructing stones does have renal calculi and mild hydronephrosis which is unchanged from prior exam they cannot exclude infection. The patient has been unable to give urine sample at this time. Request that we obtain urine from her. States that she will see her primary care doctor today and she needs to leave to make her appointment she is going out of town. States that she will have them test her urine. The patient's pain seems to be managed in the ED. She is able to by mouth challenge any emesis. We'll give prescription for anti-inflammatory and nausea medication. Have given her prescription for antibiotics given Ct results and unsure of ua given pt states she can not wait to give a urine sample. Patient is hemodynamically stable in no acute distress. Vital signs are stable. Patient afebrile. Toradol was ordered. Patient given strict return precautions. She verbalized understanding the plan of care and all questions were answered prior to discharge.   Rise Mu, PA-C 05/24/16 1610    Geoffery Lyons, MD 05/24/16 [phone]

## 2016-05-24 NOTE — ED Triage Notes (Signed)
Pt states she is having right flank pain  Pt states it has been bothering her for a couple months now  Pt has hx of kidney stones  Pt is c/o nausea and vomiting

## 2016-05-24 NOTE — ED Notes (Signed)
Unable to obtain blood pressure in triage as pt is unable to hold still due to pain  Attempted x 3

## 2016-05-24 NOTE — ED Notes (Signed)
Requested to patient to urinate. 

## 2016-05-24 NOTE — ED Notes (Signed)
Requested urine from patient. 

## 2016-05-24 NOTE — ED Notes (Signed)
Pt. Refused rectal temperature. PA,Hannah made aware.

## 2016-05-24 NOTE — Discharge Instructions (Signed)
Your Ct scan shows no obstructing stones. I can not rule out infection without your urine. I am giving you an antibiotic. Please make sure you have your urine tested at your doctors appointment today. Return to the ED if you develop any fevers, worsening vomiting, worsening pain or for any other reason. Have your potassium rechecked by your primary care doctor.

## 2016-06-19 ENCOUNTER — Encounter (HOSPITAL_COMMUNITY): Payer: Self-pay

## 2016-06-19 ENCOUNTER — Emergency Department (HOSPITAL_COMMUNITY)
Admission: EM | Admit: 2016-06-19 | Discharge: 2016-06-19 | Disposition: A | Discharge status: Home / Self Care | Payer: BLUE CROSS/BLUE SHIELD | Attending: Emergency Medicine | Admitting: Emergency Medicine

## 2016-06-19 ENCOUNTER — Emergency Department (HOSPITAL_COMMUNITY): Payer: BLUE CROSS/BLUE SHIELD

## 2016-06-19 DIAGNOSIS — R1031 Right lower quadrant pain: Secondary | ICD-10-CM | POA: Diagnosis present

## 2016-06-19 DIAGNOSIS — N3000 Acute cystitis without hematuria: Secondary | ICD-10-CM | POA: Diagnosis not present

## 2016-06-19 DIAGNOSIS — R300 Dysuria: Secondary | ICD-10-CM | POA: Diagnosis not present

## 2016-06-19 LAB — CBC WITH DIFFERENTIAL/PLATELET
BASOS PCT: 0 %
Basophils Absolute: 0 10*3/uL (ref 0.0–0.1)
EOS ABS: 0 10*3/uL (ref 0.0–0.7)
EOS PCT: 0 %
HCT: 38.5 % (ref 36.0–46.0)
Hemoglobin: 11.7 g/dL — ABNORMAL LOW (ref 12.0–15.0)
Lymphocytes Relative: 25 %
Lymphs Abs: 1.8 10*3/uL (ref 0.7–4.0)
MCH: 23.9 pg — ABNORMAL LOW (ref 26.0–34.0)
MCHC: 30.4 g/dL (ref 30.0–36.0)
MCV: 78.6 fL (ref 78.0–100.0)
MONO ABS: 0.5 10*3/uL (ref 0.1–1.0)
Monocytes Relative: 6 %
Neutro Abs: 4.9 10*3/uL (ref 1.7–7.7)
Neutrophils Relative %: 69 %
Platelets: 164 10*3/uL (ref 150–400)
RBC: 4.9 MIL/uL (ref 3.87–5.11)
RDW: 15.3 % (ref 11.5–15.5)
WBC: 7.2 10*3/uL (ref 4.0–10.5)

## 2016-06-19 LAB — URINALYSIS, ROUTINE W REFLEX MICROSCOPIC
BILIRUBIN URINE: NEGATIVE
GLUCOSE, UA: NEGATIVE mg/dL
Hgb urine dipstick: NEGATIVE
KETONES UR: NEGATIVE mg/dL
Nitrite: NEGATIVE
PH: 5 (ref 5.0–8.0)
Protein, ur: NEGATIVE mg/dL
Specific Gravity, Urine: 1.023 (ref 1.005–1.030)

## 2016-06-19 LAB — BASIC METABOLIC PANEL
Anion gap: 4 — ABNORMAL LOW (ref 5–15)
BUN: 9 mg/dL (ref 6–20)
CALCIUM: 8.7 mg/dL — AB (ref 8.9–10.3)
CO2: 32 mmol/L (ref 22–32)
CREATININE: 1.03 mg/dL — AB (ref 0.44–1.00)
Chloride: 106 mmol/L (ref 101–111)
GFR calc non Af Amer: >60 mL/min (ref >60)
Glucose, Bld: 100 mg/dL — ABNORMAL HIGH (ref 65–99)
Potassium: 3.4 mmol/L — ABNORMAL LOW (ref 3.5–5.1)
SODIUM: 142 mmol/L (ref 135–145)

## 2016-06-19 MED ORDER — DEXTROSE 5 % IV SOLN
1.0000 g | Freq: Once | INTRAVENOUS | Status: AC
  Administered 2016-06-19: 1 g via INTRAVENOUS
  Filled 2016-06-19: qty 10

## 2016-06-19 MED ORDER — HYDROMORPHONE HCL 1 MG/ML IJ SOLN
1.0000 mg | Freq: Once | INTRAMUSCULAR | Status: AC
  Administered 2016-06-19: 1 mg via INTRAVENOUS
  Filled 2016-06-19: qty 1

## 2016-06-19 MED ORDER — CEPHALEXIN 500 MG PO CAPS: 500.0000 mg | ORAL_CAPSULE | Freq: Three times a day (TID) | ORAL | 0 refills | Status: DC

## 2016-06-19 MED ORDER — HYDROMORPHONE HCL 1 MG/ML IJ SOLN
0.5000 mg | Freq: Once | INTRAMUSCULAR | Status: AC
  Administered 2016-06-19: 0.5 mg via INTRAVENOUS
  Filled 2016-06-19: qty 1

## 2016-06-19 MED ORDER — ONDANSETRON HCL 4 MG/2ML IJ SOLN
4.0000 mg | Freq: Once | INTRAMUSCULAR | Status: AC
  Administered 2016-06-19: 4 mg via INTRAVENOUS
  Filled 2016-06-19: qty 2

## 2016-06-19 MED ORDER — KETOROLAC TROMETHAMINE 30 MG/ML IJ SOLN
30.0000 mg | Freq: Once | INTRAMUSCULAR | Status: DC
  Filled 2016-06-19: qty 1

## 2016-06-19 MED ORDER — MORPHINE SULFATE (PF) 4 MG/ML IV SOLN
4.0000 mg | Freq: Once | INTRAVENOUS | Status: AC
Start: 2016-06-19 — End: 2016-06-19
  Administered 2016-06-19: 4 mg via INTRAVENOUS
  Filled 2016-06-19: qty 1

## 2016-06-19 MED ORDER — SODIUM CHLORIDE 0.9 % IV BOLUS (SEPSIS)
1000.0000 mL | Freq: Once | INTRAVENOUS | Status: AC
  Administered 2016-06-19: 1000 mL via INTRAVENOUS

## 2016-06-19 NOTE — Discharge Instructions (Signed)
Take antibiotic as directed.  Make sure you're drinking plenty of fluids and stay hydrated.  Follow-up with her primary care doctor in 24-48 hours for further evaluation.  Follow-up with referred urologist for further evaluation at an outpatient time.  Return the emergency Department for any worsening pain, fever, vomiting, difficulty urinating or any other worsening or concerning symptoms.

## 2016-06-19 NOTE — ED Triage Notes (Signed)
Patient complains of 3 days of right flank pain with decreased urination and dysuria. States that she has nausea and decreased appetite with same

## 2016-06-19 NOTE — ED Provider Notes (Signed)
[facility]-EMERGENCY DEPT Provider Note   CSN: [id number] Arrival date & time: 06/19/16  1502  By signing my name below, I, Mary Morse, attest that this documentation has been prepared under the direction and in the presence of Mary Freer, PA-C.  Electronically Signed: Karren Morse, ED Scribe. 06/19/16. 4:43 PM.  History   Chief Complaint Chief Complaint  Patient presents with  . Flank Pain  . Dysuria   The history is provided by the patient. No language interpreter was used.    HPI Comments: Mary Morse is a 36 y.o. female with a PMHx of GERD, chronic flank pain and nephrolithiasis, who presents to the Emergency Department complaining of pressure-like, waxing and waning right flank pain that started three days ago. Patient states that the right flank pain that radiates to her right side and right lower abdomen.  She reports that her pain is worsened with movement. Patient has taken Tylenol with no relief. She denies any other alleviating or admitting factors. Pt notes associated subjective fever, nausea, vomiting, dysuria, appetitite change, and intermittent generalized weakness/dizziness, and light-headedness. She notes her urine output has been very minimal. Patient states that she feels like she is "very dehydrated. " Patient has a history of kidney stones. She was diagnosed with an 8mm kidney stone in her right kidney previously in March 2018. She is followed by [facility] and is scheduled to have a removal of the kidney stone on 07/01/16. She is currently sexually active and notes occasional unprotected intercourse but has had a hysterectomy. Denies hematuria, hematemesis, syncopal episodes, shortness of breath, or vaginal bleeding.   Past Medical History:  Diagnosis Date  . Acute pyelonephritis 12/2015; 01/07/2016  . Anxiety   . Borderline diabetes   . Chronic back pain    "sometimes all over" (01/07/2016)  . Complication of anesthesia    felt that the first surgery she had  like she had a hard time waking up.  . Constipation   . GERD (gastroesophageal reflux disease)    "when I was pregnant; now only q once in awhile" (01/07/2016)  . History of kidney stones   . Migraine    "2-3 times/month" (01/07/2016)    Patient Active Problem List   Diagnosis Date Noted  . Hypokalemia 01/07/2016  . Obesity 01/07/2016  . Recurrent pyelonephritis   . History of nephrolithiasis   . Acute pyelonephritis 12/26/2015  . Migraine 12/26/2015  . Chronic back pain   . Gallstones 11/13/2012  . Renal calculus, right 11/11/2012    Past Surgical History:  Procedure Laterality Date  . ABDOMINAL HYSTERECTOMY  05/2011  . CHOLECYSTECTOMY N/A 12/06/2012   Procedure: LAPAROSCOPIC CHOLECYSTECTOMY WITH INTRAOPERATIVE CHOLANGIOGRAM;  Surgeon: Mary Askew, MD;  Location: [facility] OR;  Service: General;  Laterality: N/A;  . LITHOTRIPSY  ~ 2010; 10/2012  . REDUCTION MAMMAPLASTY Bilateral 2013  . TUBAL LIGATION  2007    OB History    No data available       Home Medications    Prior to Admission medications   Medication Sig Start Date End Date Taking? Authorizing Provider  cephALEXin (KEFLEX) 500 MG capsule Take 1 capsule (500 mg total) by mouth 3 (three) times daily. 06/19/16   Maxwell Caul, PA-C  naproxen (NAPROSYN) 375 MG tablet Take 1 tablet (375 mg total) by mouth 2 (two) times daily. 05/24/16   Rise Mu, PA-C  promethazine (PHENERGAN) 12.5 MG tablet Take 1 tablet (12.5 mg total) by mouth every 6 (six) hours as needed  for nausea or vomiting. 05/24/16   Rise Mu, PA-C    Family History Family History  Problem Relation Age of Onset  . Cancer Father        prostate  . Cancer Paternal Grandmother        breast    Social History Social History  Substance Use Topics  . Smoking status: Never Smoker  . Smokeless tobacco: Never Used  . Alcohol use No     Allergies   Patient has no known allergies.   Review of Systems Review of Systems    Constitutional: Positive for appetite change and fever.  Respiratory: Negative for shortness of breath.   Gastrointestinal: Positive for abdominal pain, nausea and vomiting.       Denies hematemesis.   Genitourinary: Positive for decreased urine volume, dysuria and flank pain (right-sided). Negative for hematuria and vaginal discharge.  Neurological: Negative for syncope.  All other systems reviewed and are negative.    Physical Exam Updated Vital Signs BP (!) 92/47 (BP Location: Left Arm)   Pulse (!) 54   Temp 98.1 F (36.7 C) (Oral)   Resp 18   SpO2 99%   Physical Exam  Constitutional: She is oriented to person, place, and time. She appears well-developed and well-nourished.  Sitting comfortably on examination table. No acute distress   HENT:  Head: Normocephalic and atraumatic.  Mouth/Throat: Oropharynx is clear and moist. Mucous membranes are dry.  Eyes: Conjunctivae, EOM and lids are normal. Pupils are equal, round, and reactive to light. Right eye exhibits no discharge. Left eye exhibits no discharge. No scleral icterus.  Cardiovascular: Normal rate, regular rhythm and normal pulses.   Pulmonary/Chest: Effort normal and breath sounds normal.  Abdominal: Normal appearance. She exhibits no distension. Bowel sounds are decreased. There is tenderness in the suprapubic area. There is CVA tenderness (right). There is no tenderness at McBurney's point.  Right-sided CVA tenderness to palpation that is followed to the right side and spread diffusely to the suprapubic region. No rebound, no guarding. No Rovsing sign, no tenderness at McBurney's point. No Psoas sign.  Musculoskeletal: Normal range of motion.  Neurological: She is alert and oriented to person, place, and time. GCS eye subscore is 4. GCS verbal subscore is 5. GCS motor subscore is 6.  Skin: Skin is warm and dry. Capillary refill takes less than 2 seconds.  Psychiatric: She has a normal mood and affect. Her speech is  normal and behavior is normal.  Nursing note and vitals reviewed.   ED Treatments / Results  DIAGNOSTIC STUDIES: Oxygen Saturation is 100% on RA, normal by my interpretation.   COORDINATION OF CARE: 4:17 PM-Discussed next steps with pt. Pt verbalized understanding and is agreeable with the plan.   Labs (all labs ordered are listed, but only abnormal results are displayed) Labs Reviewed  URINALYSIS, ROUTINE W REFLEX MICROSCOPIC - Abnormal; Notable for the following:       Result Value   APPearance CLOUDY (*)    Leukocytes, UA MODERATE (*)    Bacteria, UA FEW (*)    Squamous Epithelial / LPF 6-30 (*)    All other components within normal limits  CBC WITH DIFFERENTIAL/PLATELET - Abnormal; Notable for the following:    Hemoglobin 11.7 (*)    MCH 23.9 (*)    All other components within normal limits  BASIC METABOLIC PANEL - Abnormal; Notable for the following:    Potassium 3.4 (*)    Glucose, Bld 100 (*)  Creatinine, Ser 1.03 (*)    Calcium 8.7 (*)    Anion gap 4 (*)    All other components within normal limits  URINE CULTURE    EKG  EKG Interpretation None       Radiology Dg Abdomen 1 View  Result Date: 06/19/2016 CLINICAL DATA:  Abdominal pain for several days EXAM: ABDOMEN - 1 VIEW COMPARISON:  05/24/2016 FINDINGS: Scattered large and small bowel gas is noted. Retained fecal material is seen consistent with a degree of constipation. Right renal calculi are noted stable from the prior exam. No ureteral stones are seen. No acute bony abnormality is noted. Postsurgical changes are seen. IMPRESSION: Constipation. Right renal calculi stable from the prior CT. Electronically Signed   By: Alcide CleverMark  Lukens M.D.   On: 06/19/2016 19:18   [place] Renal  Result Date: 06/19/2016 CLINICAL DATA:  Abdominal pain, RIGHT flank pain and decreased urine output with dysuria for 3 days EXAM: RENAL / URINARY TRACT ULTRASOUND COMPLETE COMPARISON:  CT abdomen and pelvis 05/24/2016 FINDINGS: Right  Kidney: Length: 11.9 cm. Normal cortical thickness and echogenicity. Mild collecting system dilatation. Shadowing calculus at inferior pole 8 mm diameter. No renal mass. Left Kidney: Length: 11.5 cm. Normal morphology without mass, hydronephrosis, or shadowing calcification. Bladder: Appears normal for degree of bladder distention. Incidentally noted increased echogenicity of the liver question fatty infiltration though this can be seen with cirrhosis and certain infiltrative disorders. Visualized hepatic margins smooth. IMPRESSION: Mild RIGHT hydronephrosis. Shadowing 8 mm calculus at inferior pole RIGHT kidney. Question fatty infiltration of liver as above. Electronically Signed   By: Ulyses SouthwardMark  Boles M.D.   On: 06/19/2016 17:49    Procedures Procedures (including critical care time)  Medications Ordered in ED Medications  sodium chloride 0.9 % bolus 1,000 mL (0 mLs Intravenous Stopped 06/19/16 1917)  ondansetron (ZOFRAN) injection 4 mg (4 mg Intravenous Given 06/19/16 1719)  morphine 4 MG/ML injection 4 mg (4 mg Intravenous Given 06/19/16 1720)  HYDROmorphone (DILAUDID) injection 1 mg (1 mg Intravenous Given 06/19/16 1950)  cefTRIAXone (ROCEPHIN) 1 g in dextrose 5 % 50 mL IVPB (0 g Intravenous Stopped 06/19/16 2148)  HYDROmorphone (DILAUDID) injection 0.5 mg (0.5 mg Intravenous Given 06/19/16 2048)  HYDROmorphone (DILAUDID) injection 0.5 mg (0.5 mg Intravenous Given 06/19/16 2148)     Initial Impression / Assessment and Plan / ED Course  I have reviewed the triage vital signs and the nursing notes.  Pertinent labs & imaging results that were available during my care of the patient were reviewed by me and considered in my medical decision making (see chart for details).     36 year old female with known right kidney stone who presents with 3 days of waxing and waning right flank pain and dysuria. Patient is afebrile, non-toxic appearing, sitting comfortably on examination table. She has some CVA  tenderness on the right side. Consider UTI versus pyelonephritis versus kidney stone versus dehydration. Urine ordered at triage. Will order additional labs including CBC, BMP, urine culture, urine pregnancy. Records reviews and patient has had multiple CTs in the past 6 months. All show same findings of nonobstructive kidney stone in the right kidney. Given that she has had extensive history of CT imaging, with the most recent being 05/24/16 will plan to evaluate with ultrasound and KUB for evaluation of pyelonephritis. IVF given for fluid resuscitation. Toradol ordered for pain management.  Nurse came to provider and stated that patient refused the Toradol because "it does not work for her pain, she needs  something else." Review of records show patient has no known allergy to Toradol. We'll plan to give additional pain medication for symptomatic relief.  Labs and imaging reviewed. Urine shows moderate leukocytes and too numerous to count white blood cell count. CBC shows no elevation of white blood cell count. H&H is stable when compared to priors. BMP shows mild hypokalemia. Review of records show that this is consistent for patient's history. Imaging is still pending.  Discussed lab results with patient. Reevaluation. Still complaining of right flank pain and some diffuse suprapubic pain. She states that her pain is increased after morphine and she has had no relief. She is requesting additional pain medications. Through discussion, patient states that "Dilaudid is what works best for her pain." Additional pain medications given in the department. Discussed with Dr. Madilyn Hook.  Imaging reviewed. Renal ultrasound shows some mild hydronephrosis of the right kidney. There is a nonobstructing 8 mm kidney stone that is located in the kidney. Discussed results with patient. She is asking for additional pain medication small dose of additional pain medication. Given urine findings and patient's history, we'll plan to  consult urology for recommendations of management.   Dr. Madilyn Hook discussed with Dr. Retta Diones (on call for Urology). He will review the patient's record and imaging done today and will call back in the department.  Dr. Madilyn Hook discussed with Dr. Retta Diones after review of records. Patient has not been seen by [facility] for several years. He does not know of any appointment scheduled to have evaluation of her kidney stones. Recommends getting an IV dose of antibiotics in the department and discharging home a short course of Keflex for treatment of urinary tract infection. He will plan to see the patient in outpatient follow-up.  Discussed plan with patient. She reports that pain has improved after use of Dilaudid. She has been able to eat and drink in the department without any nausea/vomiting. Reexamination shows that abdominal tenderness palpation is appears significantly. She still having some right flank tenderness to palpation which is reasonable given hydronephrosis. No peritoneal signs on abdominal exam. Explained that we will send her home with treatment for UTI. Explained that I can provide her medication for nausea but she states that she has some at home and likely continue to use that. Explained to her to follow-up with urology on an outpatient basis. Strict return precautions discussed. Patient expresses understanding and agreement to plan.    Final Clinical Impressions(s) / ED Diagnoses   Final diagnoses:  Acute cystitis without hematuria    New Prescriptions Discharge Medication List as of 06/19/2016 10:13 PM    I personally performed the services described in this documentation, which was scribed in my presence. The recorded information has been reviewed and is accurate.     Maxwell Caul, PA-C 06/20/16 0121    Tilden Fossa, MD 06/21/16 [phone]

## 2016-06-22 LAB — URINE CULTURE: Culture: >=100000 — AB

## 2016-06-23 ENCOUNTER — Telehealth: Payer: Self-pay | Admitting: *Deleted

## 2016-06-23 NOTE — Telephone Encounter (Signed)
Post ED Visit - Positive Culture Follow-up  Culture report reviewed by antimicrobial stewardship pharmacist:  []  Enzo BiNathan Batchelder, Pharm.D. [x]  Celedonio MiyamotoJeremy Frens, Pharm.D., BCPS AQ-ID []  Garvin FilaMike Maccia, Pharm.D., BCPS []  Georgina PillionElizabeth Martin, Pharm.D., BCPS []  Bayou CaneMinh Pham, [address] Rainbow BoulevardPharm.D., BCPS, AAHIVP []  Estella HuskMichelle Turner, Pharm.D., BCPS, AAHIVP []  Lysle Pearlachel Rumbarger, PharmD, BCPS []  Casilda Carlsaylor Stone, PharmD, BCPS []  Pollyann SamplesAndy Johnston, PharmD, BCPS  Positive urine culture Treated with Cephalexin, organism sensitive to the same and no further patient follow-up is required at this time.  Virl AxeRobertson, Selin Eisler Talley 06/23/2016, 2:26 PM

## 2017-01-14 ENCOUNTER — Emergency Department (HOSPITAL_COMMUNITY)
Admission: EM | Admit: 2017-01-14 | Discharge: 2017-01-15 | Disposition: A | Discharge status: Home / Self Care | Payer: BLUE CROSS/BLUE SHIELD | Attending: Emergency Medicine | Admitting: Emergency Medicine

## 2017-01-14 ENCOUNTER — Other Ambulatory Visit: Payer: Self-pay

## 2017-01-14 ENCOUNTER — Encounter (HOSPITAL_COMMUNITY): Payer: Self-pay

## 2017-01-14 ENCOUNTER — Emergency Department (HOSPITAL_COMMUNITY): Payer: BLUE CROSS/BLUE SHIELD

## 2017-01-14 DIAGNOSIS — R51 Headache: Secondary | ICD-10-CM | POA: Insufficient documentation

## 2017-01-14 DIAGNOSIS — M7989 Other specified soft tissue disorders: Secondary | ICD-10-CM

## 2017-01-14 DIAGNOSIS — R0789 Other chest pain: Secondary | ICD-10-CM | POA: Insufficient documentation

## 2017-01-14 DIAGNOSIS — R600 Localized edema: Secondary | ICD-10-CM | POA: Insufficient documentation

## 2017-01-14 LAB — CBC
HEMATOCRIT: 35.8 % — AB (ref 36.0–46.0)
HEMOGLOBIN: 11.1 g/dL — AB (ref 12.0–15.0)
MCH: 24.3 pg — AB (ref 26.0–34.0)
MCHC: 31 g/dL (ref 30.0–36.0)
MCV: 78.5 fL (ref 78.0–100.0)
Platelets: 165 10*3/uL (ref 150–400)
RBC: 4.56 MIL/uL (ref 3.87–5.11)
RDW: 15.4 % (ref 11.5–15.5)
WBC: 8 10*3/uL (ref 4.0–10.5)

## 2017-01-14 LAB — BASIC METABOLIC PANEL
Anion gap: 7 (ref 5–15)
BUN: 8 mg/dL (ref 6–20)
CO2: 27 mmol/L (ref 22–32)
CREATININE: 0.8 mg/dL (ref 0.44–1.00)
Calcium: 8.6 mg/dL — ABNORMAL LOW (ref 8.9–10.3)
Chloride: 104 mmol/L (ref 101–111)
GFR calc Af Amer: >60 mL/min (ref >60)
Glucose, Bld: 99 mg/dL (ref 65–99)
POTASSIUM: 3.5 mmol/L (ref 3.5–5.1)
SODIUM: 138 mmol/L (ref 135–145)

## 2017-01-14 LAB — I-STAT TROPONIN, ED: TROPONIN I, POC: 0 ng/mL (ref 0.00–0.08)

## 2017-01-14 NOTE — ED Triage Notes (Signed)
Pt states she had a migraine last night and today she began noticing some swelling in her ankles bilaterally. Swelling noted. Pt also reports some chest tightness that started today. Skin warm and dry, no distress noted. Pt also reports some right flank pain.

## 2017-01-14 NOTE — ED Notes (Signed)
Pt stepped off waiting area to visit with family member on 5W. This EMT advised pt of staying in lobby. Pt acknowledged understanding.

## 2017-01-15 LAB — URINALYSIS, ROUTINE W REFLEX MICROSCOPIC
BILIRUBIN URINE: NEGATIVE
Glucose, UA: NEGATIVE mg/dL
Hgb urine dipstick: NEGATIVE
Ketones, ur: NEGATIVE mg/dL
LEUKOCYTES UA: NEGATIVE
Nitrite: NEGATIVE
PH: 6 (ref 5.0–8.0)
Protein, ur: NEGATIVE mg/dL
SPECIFIC GRAVITY, URINE: 1.014 (ref 1.005–1.030)

## 2017-01-15 LAB — HEPATIC FUNCTION PANEL
ALBUMIN: 3.3 g/dL — AB (ref 3.5–5.0)
ALT: 20 U/L (ref 14–54)
AST: 22 U/L (ref 15–41)
Alkaline Phosphatase: 65 U/L (ref 38–126)
TOTAL PROTEIN: 6.6 g/dL (ref 6.5–8.1)
Total Bilirubin: 0.4 mg/dL (ref 0.3–1.2)

## 2017-01-15 LAB — D-DIMER, QUANTITATIVE: D-Dimer, Quant: 0.28 ug/mL-FEU (ref 0.00–0.50)

## 2017-01-15 MED ORDER — METOCLOPRAMIDE HCL 5 MG/ML IJ SOLN
10.0000 mg | Freq: Once | INTRAMUSCULAR | Status: AC
  Administered 2017-01-15: 10 mg via INTRAVENOUS
  Filled 2017-01-15: qty 2

## 2017-01-15 MED ORDER — KETOROLAC TROMETHAMINE 30 MG/ML IJ SOLN
30.0000 mg | Freq: Once | INTRAMUSCULAR | Status: AC
  Administered 2017-01-15: 30 mg via INTRAVENOUS
  Filled 2017-01-15: qty 1

## 2017-01-15 MED ORDER — DIPHENHYDRAMINE HCL 50 MG/ML IJ SOLN
25.0000 mg | Freq: Once | INTRAMUSCULAR | Status: AC
  Administered 2017-01-15: 25 mg via INTRAVENOUS
  Filled 2017-01-15: qty 1

## 2017-01-15 NOTE — Discharge Instructions (Signed)
Please get compression stockings and wear them during the daytime for the next week.  Keep you feet elevated when not out and about.  Please follow-up with your primary care doctor.

## 2017-01-15 NOTE — ED Provider Notes (Signed)
[facility] EMERGENCY DEPARTMENT Provider Note   CSN: [id number] Arrival date & time: 01/14/17  1816     History   Chief Complaint Chief Complaint  Patient presents with  . Leg Swelling    HPI Mary Morse is a 36 y.o. female.  Patient with no pertinent PMH presents to the ED with a chief complaint of bilateral lower extremity swelling x 2 days.  She states that she has been traveling back and forth from [place] ~2hrs daily. She reports some associate chest tightness. She denies any hx of PE or DVT.  She denies any cough, SOB, or fever.  She denies any numbness, weakness, or tingling.  She states that the symptoms improve when she elevates her feet.  Additionally, patient reports having a headache which she states feels like a typical migraine.  It started yesterday and has been gradually worsening today.  She denies any fever, neck stiffness, vision/speech changes, numbness, weakness, or tingling.   The history is provided by the patient. No language interpreter was used.    Past Medical History:  Diagnosis Date  . Acute pyelonephritis 12/2015; 01/07/2016  . Anxiety   . Borderline diabetes   . Chronic back pain    "sometimes all over" (01/07/2016)  . Complication of anesthesia    felt that the first surgery she had like she had a hard time waking up.  . Constipation   . GERD (gastroesophageal reflux disease)    "when I was pregnant; now only q once in awhile" (01/07/2016)  . History of kidney stones   . Migraine    "2-3 times/month" (01/07/2016)    Patient Active Problem List   Diagnosis Date Noted  . Hypokalemia 01/07/2016  . Obesity 01/07/2016  . Recurrent pyelonephritis   . History of nephrolithiasis   . Acute pyelonephritis 12/26/2015  . Migraine 12/26/2015  . Chronic back pain   . Gallstones 11/13/2012  . Renal calculus, right 11/11/2012    Past Surgical History:  Procedure Laterality Date  . ABDOMINAL HYSTERECTOMY  05/2011  .  CHOLECYSTECTOMY N/A 12/06/2012   Procedure: LAPAROSCOPIC CHOLECYSTECTOMY WITH INTRAOPERATIVE CHOLANGIOGRAM;  Surgeon: Robyne Askew, MD;  Location: [facility] OR;  Service: General;  Laterality: N/A;  . LITHOTRIPSY  ~ 2010; 10/2012  . REDUCTION MAMMAPLASTY Bilateral 2013  . TUBAL LIGATION  2007    OB History    No data available       Home Medications    Prior to Admission medications   Medication Sig Start Date End Date Taking? Authorizing Provider  cephALEXin (KEFLEX) 500 MG capsule Take 1 capsule (500 mg total) by mouth 3 (three) times daily. 06/19/16   Maxwell Caul, PA-C  naproxen (NAPROSYN) 375 MG tablet Take 1 tablet (375 mg total) by mouth 2 (two) times daily. 05/24/16   Rise Mu, PA-C  promethazine (PHENERGAN) 12.5 MG tablet Take 1 tablet (12.5 mg total) by mouth every 6 (six) hours as needed for nausea or vomiting. 05/24/16   Rise Mu, PA-C    Family History Family History  Problem Relation Age of Onset  . Cancer Father        prostate  . Cancer Paternal Grandmother        breast    Social History Social History   Tobacco Use  . Smoking status: Never Smoker  . Smokeless tobacco: Never Used  Substance Use Topics  . Alcohol use: No  . Drug use: No     Allergies  Patient has no known allergies.   Review of Systems Review of Systems  All other systems reviewed and are negative.    Physical Exam Updated Vital Signs BP 116/76   Pulse 64   Temp 98 F (36.7 C) (Oral)   Resp 17   SpO2 97%   Physical Exam  Constitutional: She is oriented to person, place, and time. She appears well-developed and well-nourished.  HENT:  Head: Normocephalic and atraumatic.  Eyes: Conjunctivae and EOM are normal. Pupils are equal, round, and reactive to light.  Neck: Normal range of motion. Neck supple.  Cardiovascular: Normal rate and regular rhythm. Exam reveals no gallop and no friction rub.  No murmur heard. Pulmonary/Chest: Effort normal and  breath sounds normal. No respiratory distress. She has no wheezes. She has no rales. She exhibits no tenderness.  Abdominal: Soft. Bowel sounds are normal. She exhibits no distension and no mass. There is no tenderness. There is no rebound and no guarding.  Musculoskeletal: Normal range of motion. She exhibits edema. She exhibits no tenderness.  1+ pitting edema in bilateral lower extremities  Neurological: She is alert and oriented to person, place, and time.  Skin: Skin is warm and dry.  No evidence of cellulitis  Psychiatric: She has a normal mood and affect. Her behavior is normal. Judgment and thought content normal.  Nursing note and vitals reviewed.    ED Treatments / Results  Labs (all labs ordered are listed, but only abnormal results are displayed) Labs Reviewed  BASIC METABOLIC PANEL - Abnormal; Notable for the following components:      Result Value   Calcium 8.6 (*)    All other components within normal limits  CBC - Abnormal; Notable for the following components:   Hemoglobin 11.1 (*)    HCT 35.8 (*)    MCH 24.3 (*)    All other components within normal limits  HEPATIC FUNCTION PANEL  D-DIMER, QUANTITATIVE (NOT [facility])  URINALYSIS, ROUTINE W REFLEX MICROSCOPIC  I-STAT TROPONIN, ED    EKG  EKG Interpretation None       Radiology Dg Chest 2 View  Result Date: 01/14/2017 CLINICAL DATA:  36 year old female with chest tightness and bilateral ankle swelling EXAM: CHEST  2 VIEW COMPARISON:  02/26/2014 FINDINGS: The lungs are clear and negative for focal airspace consolidation, pulmonary edema or suspicious pulmonary nodule. No pleural effusion or pneumothorax. Cardiac and mediastinal contours are within normal limits. No acute fracture or lytic or blastic osseous lesions. The visualized upper abdominal bowel gas pattern is unremarkable. Surgical clips in the right upper quadrant suggest prior cholecystectomy. IMPRESSION: Normal chest x-ray. Electronically Signed    By: Malachy MoanHeath  McCullough M.D.   On: 01/14/2017 18:52    Procedures Procedures (including critical care time)  Medications Ordered in ED Medications  ketorolac (TORADOL) 30 MG/ML injection 30 mg (not administered)  metoCLOPramide (REGLAN) injection 10 mg (not administered)  diphenhydrAMINE (BENADRYL) injection 25 mg (not administered)     Initial Impression / Assessment and Plan / ED Course  I have reviewed the triage vital signs and the nursing notes.  Pertinent labs & imaging results that were available during my care of the patient were reviewed by me and considered in my medical decision making (see chart for details).     Patient with bilateral lower extremity swelling.  She reports some associated chest tightness, but no SOB.  She has been [place] because [facility]  DDx includes DVT, though thought to be less likely given bilateral symptoms.  Cr is normal.  Will check LFT. CXR is clear.  If labs are reassuring, will plan for discharge to home with compression stockings and PCP follow-up.  Final Clinical Impressions(s) / ED Diagnoses   Final diagnoses:  Leg swelling    ED Discharge Orders    None       Roxy HorsemanBrowning, Simran Bomkamp, PA-C 01/15/17 0209    Shon BatonHorton, Courtney F, MD 01/15/17 [phone]

## 2017-01-26 ENCOUNTER — Emergency Department (HOSPITAL_COMMUNITY)
Admission: EM | Admit: 2017-01-26 | Discharge: 2017-01-26 | Disposition: A | Discharge status: Home / Self Care | Payer: BLUE CROSS/BLUE SHIELD | Attending: Emergency Medicine | Admitting: Emergency Medicine

## 2017-01-26 ENCOUNTER — Encounter (HOSPITAL_COMMUNITY): Payer: Self-pay | Admitting: Emergency Medicine

## 2017-01-26 DIAGNOSIS — Z79899 Other long term (current) drug therapy: Secondary | ICD-10-CM | POA: Diagnosis not present

## 2017-01-26 DIAGNOSIS — N390 Urinary tract infection, site not specified: Secondary | ICD-10-CM | POA: Insufficient documentation

## 2017-01-26 DIAGNOSIS — R1030 Lower abdominal pain, unspecified: Secondary | ICD-10-CM | POA: Insufficient documentation

## 2017-01-26 DIAGNOSIS — R300 Dysuria: Secondary | ICD-10-CM | POA: Diagnosis present

## 2017-01-26 DIAGNOSIS — E119 Type 2 diabetes mellitus without complications: Secondary | ICD-10-CM | POA: Diagnosis not present

## 2017-01-26 DIAGNOSIS — R102 Pelvic and perineal pain: Secondary | ICD-10-CM

## 2017-01-26 DIAGNOSIS — R110 Nausea: Secondary | ICD-10-CM

## 2017-01-26 LAB — URINALYSIS, ROUTINE W REFLEX MICROSCOPIC
BILIRUBIN URINE: NEGATIVE
Glucose, UA: NEGATIVE mg/dL
KETONES UR: NEGATIVE mg/dL
Nitrite: POSITIVE — AB
PROTEIN: NEGATIVE mg/dL
Specific Gravity, Urine: 1.018 (ref 1.005–1.030)
pH: 5 (ref 5.0–8.0)

## 2017-01-26 LAB — POC URINE PREG, ED: PREG TEST UR: NEGATIVE

## 2017-01-26 MED ORDER — CEPHALEXIN 500 MG PO CAPS: ORAL_CAPSULE | ORAL | 0 refills | Status: DC

## 2017-01-26 MED ORDER — ONDANSETRON 4 MG PO TBDP: 4.0000 mg | ORAL_TABLET | Freq: Three times a day (TID) | ORAL | 0 refills | Status: DC | PRN

## 2017-01-26 NOTE — Discharge Instructions (Signed)
Stay very well hydrated with plenty of water throughout the day. Take antibiotic until completed. Use zofran as directed as needed for nausea. Alternate between tylenol and motrin as needed for pain. Follow up with your primary care physician in 1 week for recheck of ongoing symptoms but return to ER for emergent changing or worsening of symptoms.  Please seek immediate care if you develop the following: You develop back pain.  Your symptoms are no better, or worse in 3 days. There is severe back pain or lower abdominal pain.  You develop chills.  You have a fever.  There is nausea or vomiting.  There is continued burning or discomfort with urination.

## 2017-01-26 NOTE — ED Provider Notes (Signed)
[facility]-EMERGENCY DEPT Provider Note   CSN: [id number] Arrival date & time: 01/26/17  1456     History   Chief Complaint Chief Complaint  Patient presents with  . Dysuria  . Nephrolithiasis    HPI Mary Morse is a 37 y.o. female with a PMHx of nephrolithiasis, borderline DM2, chronic back pain, constipation, GERD, migraines, recurrent pyelonephritis, and other conditions listed below, and PSHx of lithotripsy, cholecystectomy, abd hysterectomy, and b/l tubal ligation, who presents to the ED with complaints of 3 days of increased urinary frequency, urgency, sensation of incomplete bladder emptying, dysuria, suprapubic pressure during urination, and nausea.  She feels like she has another UTI.  She has been trying Azo with no relief, no known aggravating factors other than urinating.  She states that she has a known kidney stone in her right kidney based on a CT scan from several years ago, but she has not followed up for any further intervention after she had lithotripsy done and "it failed".  She does not have any flank pain today, and this does not feel like prior kidney stone pain.  She denies fevers, chills, CP, SOB, V/D/C, obstipation, melena, hematochezia, vaginal bleeding/discharge, hematuria, flank pain, myalgias, arthralgias, numbness, tingling, focal weakness, or any other complaints at this time.  She also denies any recent travel, sick contacts, suspicious food intake, EtOH use, or frequent NSAID use.    The history is provided by the patient and medical records. No language interpreter was used.  Dysuria   This is a new problem. The current episode started more than 2 days ago. The problem occurs every urination. The problem has not changed since onset.The quality of the pain is described as burning (pressure). The pain is moderate. There has been no fever. There is a history of pyelonephritis. Associated symptoms include nausea, frequency and urgency.  Pertinent negatives include no chills, no vomiting, no discharge, no hematuria, no possible pregnancy and no flank pain. Treatments tried: AZO. Her past medical history is significant for kidney stones and recurrent UTIs.    Past Medical History:  Diagnosis Date  . Acute pyelonephritis 12/2015; 01/07/2016  . Anxiety   . Borderline diabetes   . Chronic back pain    "sometimes all over" (01/07/2016)  . Complication of anesthesia    felt that the first surgery she had like she had a hard time waking up.  . Constipation   . GERD (gastroesophageal reflux disease)    "when I was pregnant; now only q once in awhile" (01/07/2016)  . History of kidney stones   . Migraine    "2-3 times/month" (01/07/2016)    Patient Active Problem List   Diagnosis Date Noted  . Hypokalemia 01/07/2016  . Obesity 01/07/2016  . Recurrent pyelonephritis   . History of nephrolithiasis   . Acute pyelonephritis 12/26/2015  . Migraine 12/26/2015  . Chronic back pain   . Gallstones 11/13/2012  . Renal calculus, right 11/11/2012    Past Surgical History:  Procedure Laterality Date  . ABDOMINAL HYSTERECTOMY  05/2011  . CHOLECYSTECTOMY N/A 12/06/2012   Procedure: LAPAROSCOPIC CHOLECYSTECTOMY WITH INTRAOPERATIVE CHOLANGIOGRAM;  Surgeon: Robyne Askew, MD;  Location: [facility] OR;  Service: General;  Laterality: N/A;  . LITHOTRIPSY  ~ 2010; 10/2012  . REDUCTION MAMMAPLASTY Bilateral 2013  . TUBAL LIGATION  2007    OB History    No data available       Home Medications    Prior to Admission medications  Medication Sig Start Date End Date Taking? Authorizing Provider  cephALEXin (KEFLEX) 500 MG capsule Take 1 capsule (500 mg total) by mouth 3 (three) times daily. 06/19/16   Maxwell CaulLayden, Lindsey A, PA-C  naproxen (NAPROSYN) 375 MG tablet Take 1 tablet (375 mg total) by mouth 2 (two) times daily. 05/24/16   Rise MuLeaphart, Kenneth T, PA-C  promethazine (PHENERGAN) 12.5 MG tablet Take 1 tablet (12.5 mg total) by mouth every 6  (six) hours as needed for nausea or vomiting. 05/24/16   Rise MuLeaphart, Kenneth T, PA-C    Family History Family History  Problem Relation Age of Onset  . Cancer Father        prostate  . Cancer Paternal Grandmother        breast    Social History Social History   Tobacco Use  . Smoking status: Never Smoker  . Smokeless tobacco: Never Used  Substance Use Topics  . Alcohol use: No  . Drug use: No     Allergies   Patient has no known allergies.   Review of Systems Review of Systems  Constitutional: Negative for chills and fever.  Respiratory: Negative for shortness of breath.   Cardiovascular: Negative for chest pain.  Gastrointestinal: Positive for abdominal pain and nausea. Negative for blood in stool, constipation, diarrhea and vomiting.  Genitourinary: Positive for dysuria, frequency and urgency. Negative for flank pain, hematuria, vaginal bleeding and vaginal discharge.       +sensation of incomplete bladder emptying  Musculoskeletal: Negative for arthralgias and myalgias.  Skin: Negative for color change.  Allergic/Immunologic: Negative for immunocompromised state.  Neurological: Negative for weakness and numbness.  Psychiatric/Behavioral: Negative for confusion.   All other systems reviewed and are negative for acute change except as noted in the HPI.    Physical Exam Updated Vital Signs BP 106/72 (BP Location: Right Arm)   Pulse 79   Temp 98 F (36.7 C) (Oral)   Resp 18   SpO2 96%   Physical Exam  Constitutional: She is oriented to person, place, and time. Vital signs are normal. She appears well-developed and well-nourished.  Non-toxic appearance. No distress.  Afebrile, nontoxic, NAD  HENT:  Head: Normocephalic and atraumatic.  Mouth/Throat: Oropharynx is clear and moist and mucous membranes are normal.  Eyes: Conjunctivae and EOM are normal. Right eye exhibits no discharge. Left eye exhibits no discharge.  Neck: Normal range of motion. Neck supple.    Cardiovascular: Normal rate, regular rhythm, normal heart sounds and intact distal pulses. Exam reveals no gallop and no friction rub.  No murmur heard. Pulmonary/Chest: Effort normal and breath sounds normal. No respiratory distress. She has no decreased breath sounds. She has no wheezes. She has no rhonchi. She has no rales.  Abdominal: Soft. Normal appearance and bowel sounds are normal. She exhibits no distension. There is tenderness in the suprapubic area. There is no rigidity, no rebound, no guarding, no CVA tenderness, no tenderness at McBurney's point and negative Murphy's sign.  Soft, nondistended, +BS throughout, with mild suprapubic TTP, no r/g/r, neg murphy's, neg mcburney's, no CVA TTP   Musculoskeletal: Normal range of motion.  Neurological: She is alert and oriented to person, place, and time. She has normal strength. No sensory deficit.  Skin: Skin is warm, dry and intact. No rash noted.  Psychiatric: She has a normal mood and affect.  Nursing note and vitals reviewed.    ED Treatments / Results  Labs (all labs ordered are listed, but only abnormal results are displayed) Labs Reviewed  URINALYSIS, ROUTINE W REFLEX MICROSCOPIC - Abnormal; Notable for the following components:      Result Value   Color, Urine AMBER (*)    APPearance HAZY (*)    Hgb urine dipstick SMALL (*)    Nitrite POSITIVE (*)    Leukocytes, UA SMALL (*)    Bacteria, UA RARE (*)    Squamous Epithelial / LPF TOO NUMEROUS TO COUNT (*)    All other components within normal limits  POC URINE PREG, ED    EKG  EKG Interpretation None       Radiology No results found.  Procedures Procedures (including critical care time)  Medications Ordered in ED Medications - No data to display   Initial Impression / Assessment and Plan / ED Course  I have reviewed the triage vital signs and the nursing notes.  Pertinent labs & imaging results that were available during my care of the patient were  reviewed by me and considered in my medical decision making (see chart for details).     37 y.o. female here with UTI symptoms x3 days, including incomplete bladder emptying sensation, suprapubic pressure, dysuria, urine freq/urg, and nausea. No flank pain or hematuria noted. On exam, mild suprapubic TTP, nonperitoneal, no flank tenderness. Work up thus far reveals: Upreg neg, U/A with +nitrites +leuks, TNTC squamous so grossly contaminated however with +nitrites it's likely still a UTI; TNTC WBCs also seen, and 0-5 RBCs so not much hematuria. Doubt kidney stone given lack of symptoms related to that; symptoms most consistent with UTI. Doubt need for labs or other emergent work up at this time. Will send home with zofran and keflex, advised tylenol/motrin and adequate hydration, f/up with PCP in 1wk. I explained the diagnosis and have given explicit precautions to return to the ER including for any other new or worsening symptoms. The patient understands and accepts the medical plan as it's been dictated and I have answered their questions. Discharge instructions concerning home care and prescriptions have been given. The patient is STABLE and is discharged to home in good condition.    Final Clinical Impressions(s) / ED Diagnoses   Final diagnoses:  Lower urinary tract infectious disease  Suprapubic abdominal pain  Nausea    ED Discharge Orders        Ordered    cephALEXin (KEFLEX) 500 MG capsule     01/26/17 1913    ondansetron (ZOFRAN ODT) 4 MG disintegrating tablet  Every 8 hours PRN     01/26/17 [address] [place], [place] 01/26/17 1920    Linwood Dibbles, MD 01/26/17 2310

## 2017-01-26 NOTE — ED Triage Notes (Signed)
Patient c/o kidney stone in right kidney and having dysuria for past couple days. Denies any blood in urine but lots of pressure with urination.

## 2017-04-28 ENCOUNTER — Encounter (HOSPITAL_COMMUNITY): Payer: Self-pay | Admitting: Emergency Medicine

## 2017-04-28 ENCOUNTER — Other Ambulatory Visit: Payer: Self-pay

## 2017-04-28 ENCOUNTER — Emergency Department (HOSPITAL_COMMUNITY): Payer: Self-pay

## 2017-04-28 DIAGNOSIS — K5904 Chronic idiopathic constipation: Secondary | ICD-10-CM | POA: Insufficient documentation

## 2017-04-28 DIAGNOSIS — N390 Urinary tract infection, site not specified: Secondary | ICD-10-CM | POA: Insufficient documentation

## 2017-04-28 DIAGNOSIS — R0781 Pleurodynia: Secondary | ICD-10-CM | POA: Insufficient documentation

## 2017-04-28 LAB — URINALYSIS, ROUTINE W REFLEX MICROSCOPIC
Bilirubin Urine: NEGATIVE
GLUCOSE, UA: 50 mg/dL — AB
Ketones, ur: NEGATIVE mg/dL
Nitrite: NEGATIVE
PH: 5 (ref 5.0–8.0)
Protein, ur: 100 mg/dL — AB
SPECIFIC GRAVITY, URINE: 1.016 (ref 1.005–1.030)

## 2017-04-28 LAB — CBC
HCT: 37.8 % (ref 36.0–46.0)
HEMOGLOBIN: 11.6 g/dL — AB (ref 12.0–15.0)
MCH: 24 pg — ABNORMAL LOW (ref 26.0–34.0)
MCHC: 30.7 g/dL (ref 30.0–36.0)
MCV: 78.1 fL (ref 78.0–100.0)
Platelets: 193 10*3/uL (ref 150–400)
RBC: 4.84 MIL/uL (ref 3.87–5.11)
RDW: 15.9 % — ABNORMAL HIGH (ref 11.5–15.5)
WBC: 8.4 10*3/uL (ref 4.0–10.5)

## 2017-04-28 LAB — I-STAT TROPONIN, ED: Troponin i, poc: 0 ng/mL (ref 0.00–0.08)

## 2017-04-28 LAB — BASIC METABOLIC PANEL
Anion gap: 9 (ref 5–15)
BUN: 10 mg/dL (ref 6–20)
CO2: 29 mmol/L (ref 22–32)
CREATININE: 0.98 mg/dL (ref 0.44–1.00)
Calcium: 8.9 mg/dL (ref 8.9–10.3)
Chloride: 102 mmol/L (ref 101–111)
GFR calc Af Amer: >60 mL/min (ref >60)
GFR calc non Af Amer: >60 mL/min (ref >60)
Glucose, Bld: 96 mg/dL (ref 65–99)
POTASSIUM: 3.4 mmol/L — AB (ref 3.5–5.1)
SODIUM: 140 mmol/L (ref 135–145)

## 2017-04-28 LAB — I-STAT BETA HCG BLOOD, ED (MC, WL, AP ONLY): I-stat hCG, quantitative: <5 m[IU]/mL (ref <5)

## 2017-04-28 NOTE — ED Triage Notes (Signed)
Pt reports generalized chest pain and back pain for "a couple of days."  Slight nausea, no appetite and reports her heart has "been fluttering."  Pt stated she thought it might be b/c she has not had a bowel movement in about two weeks.  She also reports problems w/ "my bladder."

## 2017-04-28 NOTE — ED Notes (Signed)
Pt called for recheck vitals, no answer 

## 2017-04-28 NOTE — ED Notes (Signed)
Pt called twice for recheck vitals, no answer

## 2017-04-28 NOTE — ED Notes (Signed)
Pt came back to the department and let staff know that she had stepped outside for some air and still wants to be seen.

## 2017-04-29 ENCOUNTER — Emergency Department (HOSPITAL_COMMUNITY): Payer: Self-pay

## 2017-04-29 ENCOUNTER — Emergency Department (HOSPITAL_COMMUNITY)
Admission: EM | Admit: 2017-04-29 | Discharge: 2017-04-29 | Disposition: A | Discharge status: Home / Self Care | Payer: Self-pay | Attending: Emergency Medicine | Admitting: Emergency Medicine

## 2017-04-29 ENCOUNTER — Encounter (HOSPITAL_COMMUNITY): Payer: Self-pay | Admitting: Radiology

## 2017-04-29 DIAGNOSIS — N390 Urinary tract infection, site not specified: Secondary | ICD-10-CM

## 2017-04-29 DIAGNOSIS — R0781 Pleurodynia: Secondary | ICD-10-CM

## 2017-04-29 DIAGNOSIS — K5904 Chronic idiopathic constipation: Secondary | ICD-10-CM

## 2017-04-29 LAB — I-STAT TROPONIN, ED: TROPONIN I, POC: 0 ng/mL (ref 0.00–0.08)

## 2017-04-29 LAB — D-DIMER, QUANTITATIVE: D-Dimer, Quant: 0.69 ug/mL-FEU — ABNORMAL HIGH (ref 0.00–0.50)

## 2017-04-29 MED ORDER — PEG 3350-KCL-NABCB-NACL-NASULF 236 G PO SOLR: 4.0000 L | Freq: Once | ORAL | 0 refills | Status: AC

## 2017-04-29 MED ORDER — IOPAMIDOL (ISOVUE-370) INJECTION 76%
INTRAVENOUS | Status: AC
  Filled 2017-04-29: qty 100

## 2017-04-29 MED ORDER — NITROFURANTOIN MONOHYD MACRO 100 MG PO CAPS: 100.0000 mg | ORAL_CAPSULE | Freq: Two times a day (BID) | ORAL | 0 refills | Status: AC

## 2017-04-29 MED ORDER — IOPAMIDOL (ISOVUE-370) INJECTION 76%
80.0000 mL | Freq: Once | INTRAVENOUS | Status: AC | PRN
  Administered 2017-04-29: 80 mL via INTRAVENOUS

## 2017-04-29 MED ORDER — NITROFURANTOIN MONOHYD MACRO 100 MG PO CAPS
100.0000 mg | ORAL_CAPSULE | Freq: Once | ORAL | Status: AC
  Administered 2017-04-29: 100 mg via ORAL
  Filled 2017-04-29: qty 1

## 2017-04-29 MED ORDER — LORAZEPAM 2 MG/ML IJ SOLN
1.0000 mg | Freq: Once | INTRAMUSCULAR | Status: AC
  Administered 2017-04-29: 1 mg via INTRAVENOUS
  Filled 2017-04-29: qty 1

## 2017-04-29 MED ORDER — ONDANSETRON 4 MG PO TBDP
4.0000 mg | ORAL_TABLET | Freq: Once | ORAL | Status: AC
  Administered 2017-04-29: 4 mg via ORAL
  Filled 2017-04-29: qty 1

## 2017-04-29 MED ORDER — METOCLOPRAMIDE HCL 10 MG PO TABS
10.0000 mg | ORAL_TABLET | Freq: Once | ORAL | Status: AC
  Administered 2017-04-29: 10 mg via ORAL
  Filled 2017-04-29: qty 1

## 2017-04-29 MED ORDER — IBUPROFEN 800 MG PO TABS
800.0000 mg | ORAL_TABLET | Freq: Once | ORAL | Status: AC
Start: 2017-04-29 — End: 2017-04-29
  Administered 2017-04-29: 800 mg via ORAL
  Filled 2017-04-29: qty 1

## 2017-04-29 NOTE — ED Notes (Signed)
Patient transported to CT 

## 2017-04-29 NOTE — ED Provider Notes (Signed)
[facility] EMERGENCY DEPARTMENT Provider Note   CSN: [id number] Arrival date & time: 04/28/17  1932     History   Chief Complaint Chief Complaint  Patient presents with  . Chest Pain  . Constipation    HPI Mary Morse is a 37 y.o. female.  The history is provided by the patient.  Chest Pain    Constipation    She has history chronic back pain, constipation, GERD, kidney stones and comes in complaining of pain in her chest and across her mid back.  Pain is been present for about 4 days and has been constant.  Chest pain is worse when she takes a deep breath.  Pain is sharp and she rates it at 9/10.  There has been some nausea but no vomiting.  She also relates that she has not had a bowel movement in the last 2 weeks.  She frequently gets discomfort like this when she gets constipated.  She tells me that her father gave her 2 laxatives to take last night and she did have a small bowel movement, but she has had some abdominal cramping with that.  She is also complaining of some urinary frequency with occasional urge incontinence.  She denies fever or chills.  She denies cough.  There is been no recent travel.  She denies recent surgery.  She is not on oral contraceptives.  She is a non-smoker.  There is family history of premature coronary atherosclerosis.  Past Medical History:  Diagnosis Date  . Acute pyelonephritis 12/2015; 01/07/2016  . Anxiety   . Borderline diabetes   . Chronic back pain    "sometimes all over" (01/07/2016)  . Complication of anesthesia    felt that the first surgery she had like she had a hard time waking up.  . Constipation   . GERD (gastroesophageal reflux disease)    "when I was pregnant; now only q once in awhile" (01/07/2016)  . History of kidney stones   . Migraine    "2-3 times/month" (01/07/2016)    Patient Active Problem List   Diagnosis Date Noted  . Hypokalemia 01/07/2016  . Obesity 01/07/2016  . Recurrent  pyelonephritis   . History of nephrolithiasis   . Acute pyelonephritis 12/26/2015  . Migraine 12/26/2015  . Chronic back pain   . Gallstones 11/13/2012  . Renal calculus, right 11/11/2012    Past Surgical History:  Procedure Laterality Date  . ABDOMINAL HYSTERECTOMY  05/2011  . CHOLECYSTECTOMY N/A 12/06/2012   Procedure: LAPAROSCOPIC CHOLECYSTECTOMY WITH INTRAOPERATIVE CHOLANGIOGRAM;  Surgeon: Robyne Askew, MD;  Location: [facility] OR;  Service: General;  Laterality: N/A;  . LITHOTRIPSY  ~ 2010; 10/2012  . REDUCTION MAMMAPLASTY Bilateral 2013  . TUBAL LIGATION  2007     OB History   None      Home Medications    Prior to Admission medications   Medication Sig Start Date End Date Taking? Authorizing Provider  cephALEXin (KEFLEX) 500 MG capsule 2 caps po bid x 7 days 01/26/17   Street, Rem[place], PA-C  naproxen (NAPROSYN) 375 MG tablet Take 1 tablet (375 mg total) by mouth 2 (two) times daily. 05/24/16   Demetrios Loll T, PA-C  ondansetron (ZOFRAN ODT) 4 MG disintegrating tablet Take 1 tablet (4 mg total) by mouth every 8 (eight) hours as needed for nausea or vomiting. 01/26/17   Street, Mercedes, PA-C  promethazine (PHENERGAN) 12.5 MG tablet Take 1 tablet (12.5 mg total) by mouth every 6 (six) hours  as needed for nausea or vomiting. 05/24/16   Rise Mu, PA-C    Family History Family History  Problem Relation Age of Onset  . Cancer Father        prostate  . Cancer Paternal Grandmother        breast    Social History Social History   Tobacco Use  . Smoking status: Never Smoker  . Smokeless tobacco: Never Used  Substance Use Topics  . Alcohol use: No  . Drug use: No     Allergies   Patient has no known allergies.   Review of Systems Review of Systems  Cardiovascular: Positive for chest pain.  Gastrointestinal: Positive for constipation.  All other systems reviewed and are negative.    Physical Exam Updated Vital Signs BP 103/61   Pulse 67   Temp  98.3 F (36.8 C) (Oral)   Resp 20   Ht 5\' 1"  (1.549 m)   Wt 88.5 kg (195 lb)   SpO2 100%   BMI 36.84 kg/m   Physical Exam  Nursing note and vitals reviewed.  37 year old female, resting comfortably and in no acute distress. Vital signs are normal. Oxygen saturation is 100%, which is normal. Head is normocephalic and atraumatic. PERRLA, EOMI. Oropharynx is clear. Neck is nontender and supple without adenopathy or JVD. Back is nontender and there is no CVA tenderness. Lungs are clear without rales, wheezes, or rhonchi. Chest is nontender. Heart has regular rate and rhythm without murmur. Abdomen is soft, flat, nontender without masses or hepatosplenomegaly and peristalsis is normoactive. Extremities have no cyanosis or edema, full range of motion is present. Skin is warm and dry without rash. Neurologic: Mental status is normal, cranial nerves are intact, there are no motor or sensory deficits.  ED Treatments / Results  Labs (all labs ordered are listed, but only abnormal results are displayed) Labs Reviewed  BASIC METABOLIC PANEL - Abnormal; Notable for the following components:      Result Value   Potassium 3.4 (*)    All other components within normal limits  CBC - Abnormal; Notable for the following components:   Hemoglobin 11.6 (*)    MCH 24.0 (*)    RDW 15.9 (*)    All other components within normal limits  URINALYSIS, ROUTINE W REFLEX MICROSCOPIC - Abnormal; Notable for the following components:   APPearance CLOUDY (*)    Glucose, UA 50 (*)    Hgb urine dipstick SMALL (*)    Protein, ur 100 (*)    Leukocytes, UA LARGE (*)    Bacteria, UA RARE (*)    Squamous Epithelial / LPF 0-5 (*)    All other components within normal limits  D-DIMER, QUANTITATIVE (NOT AT [facility]) - Abnormal; Notable for the following components:   D-Dimer, Quant 0.69 (*)    All other components within normal limits  I-STAT TROPONIN, ED  I-STAT BETA HCG BLOOD, ED ([facility], [facility], [facility] ONLY)  I-STAT  TROPONIN, ED    EKG EKG Interpretation  Date/Time:  Friday April 28 2017 19:37:27 EDT Ventricular Rate:  70 PR Interval:  138 QRS Duration: 78 QT Interval:  376 QTC Calculation: 406 R Axis:   83 Text Interpretation:  Normal sinus rhythm Normal ECG When compared with ECG of 01/14/2017, No significant change was found Confirmed by Dione Booze ([id number]) on 04/29/2017 12:43:12 AM   Radiology Dg Chest 2 View  Result Date: 04/28/2017 CLINICAL DATA:  Chest pain for 4 days EXAM: CHEST - 2  VIEW COMPARISON:  01/14/2017 FINDINGS: The heart size and mediastinal contours are within normal limits. Both lungs are clear. The visualized skeletal structures are unremarkable. IMPRESSION: No active cardiopulmonary disease. Electronically Signed   By: Gaylyn Rong M.D.   On: 04/28/2017 20:54   Ct Angio Chest Pe W And/or Wo Contrast  Result Date: 04/29/2017 CLINICAL DATA:  Chest and back pain for several days EXAM: CT ANGIOGRAPHY CHEST WITH CONTRAST TECHNIQUE: Multidetector CT imaging of the chest was performed using the standard protocol during bolus administration of intravenous contrast. Multiplanar CT image reconstructions and MIPs were obtained to evaluate the vascular anatomy. CONTRAST:  80mL ISOVUE-370 IOPAMIDOL (ISOVUE-370) INJECTION 76% COMPARISON:  04/28/2017 FINDINGS: Cardiovascular: Thoracic aorta is well visualized without significant atherosclerotic change. No aneurysmal dilatation or dissection is noted. Heart is not significantly enlarged. The pulmonary artery shows a normal branching pattern without significant pulmonary embolism. No coronary calcifications are noted. Mediastinum/Nodes: The esophagus is within normal limits. No significant hilar or mediastinal adenopathy is noted. The thoracic inlet is unremarkable. Lungs/Pleura: The lungs are well aerated bilaterally. No focal infiltrate or sizable effusion is seen. Upper Abdomen: No acute abnormality. Musculoskeletal: No acute bony abnormality  is noted. Review of the MIP images confirms the above findings. IMPRESSION: No evidence of pulmonary emboli. Aortic Atherosclerosis (ICD10-I70.0). Electronically Signed   By: Alcide Clever M.D.   On: 04/29/2017 07:29    Procedures Procedures   Medications Ordered in ED Medications  ibuprofen (ADVIL,MOTRIN) tablet 800 mg (has no administration in time range)  metoCLOPramide (REGLAN) tablet 10 mg (has no administration in time range)  nitrofurantoin (macrocrystal-monohydrate) (MACROBID) capsule 100 mg (has no administration in time range)  ondansetron (ZOFRAN-ODT) disintegrating tablet 4 mg (4 mg Oral Given 04/29/17 0037)     Initial Impression / Assessment and Plan / ED Course  I have reviewed the triage vital signs and the nursing notes.  Pertinent labs & imaging results that were available during my care of the patient were reviewed by me and considered in my medical decision making (see chart for details).  Chest pain and back pain of uncertain cause.  Possible viral pleurisy.  Doubt pneumonia.  Very low risk for coronary artery disease.  Heart score is 1, which puts her at very low risk for major adverse cardiac events in the next 30 days.  No risk factors for pulmonary embolism, but will screen with d-dimer.  Urinalysis does show evidence of infection and this may be because of some of her symptoms.  Review of old records shows several visits for UTIs.  She is given a dose of nitrofurantoin and a dose of ibuprofen.  We will plan to treat her constipation with polyethylene glycol.  D-dimer is positive.  She is sent for CT angiogram which is negative for pulmonary emboli or other acute process.  She did not get significant relief of pain with ibuprofen.  Advised that I did not feel narcotics would be appropriate given her problem with underlying constipation.  She is discharged with instructions to use over-the-counter analgesics as needed for pain, use ice and heat as needed.  Given  prescription for polyethylene glycol for her constipation and advised he use it daily to maintain a good bowel regimen.  Scription given for nitrofurantoin for UTI.  Follow-up with PCP.  Final Clinical Impressions(s) / ED Diagnoses   Final diagnoses:  Pleuritic chest pain  Urinary tract infection without hematuria, site unspecified  Functional constipation    ED Discharge Orders  Ordered    nitrofurantoin, macrocrystal-monohydrate, (MACROBID) 100 MG capsule  2 times daily     04/29/17 0749    polyethylene glycol (GOLYTELY) 236 g solution   Once     04/29/17 0749       Dione BoozeGlick, Jakari Jacot, MD 04/29/17 [phone]

## 2017-04-29 NOTE — ED Notes (Signed)
Pt seen walking outside  

## 2017-04-29 NOTE — Discharge Instructions (Addendum)
Take acetaminophen or ibuprofen as needed for pain. Apply ice, heat as needed.  For long-term management of constipation, take Miralax every day - increase your dose every few dayx until you are having regular bowel movements.

## 2017-04-29 NOTE — ED Notes (Signed)
ED Provider at bedside. 

## 2017-08-13 ENCOUNTER — Encounter (HOSPITAL_COMMUNITY): Payer: Self-pay

## 2017-08-13 ENCOUNTER — Emergency Department (HOSPITAL_COMMUNITY): Payer: Self-pay

## 2017-08-13 ENCOUNTER — Emergency Department (HOSPITAL_COMMUNITY)
Admission: EM | Admit: 2017-08-13 | Discharge: 2017-08-13 | Disposition: A | Discharge status: Home / Self Care | Payer: Self-pay | Attending: Emergency Medicine | Admitting: Emergency Medicine

## 2017-08-13 DIAGNOSIS — R079 Chest pain, unspecified: Secondary | ICD-10-CM

## 2017-08-13 DIAGNOSIS — K219 Gastro-esophageal reflux disease without esophagitis: Secondary | ICD-10-CM | POA: Insufficient documentation

## 2017-08-13 LAB — CBC
HEMATOCRIT: 41.6 % (ref 36.0–46.0)
HEMOGLOBIN: 12.9 g/dL (ref 12.0–15.0)
MCH: 23.7 pg — ABNORMAL LOW (ref 26.0–34.0)
MCHC: 31 g/dL (ref 30.0–36.0)
MCV: 76.3 fL — ABNORMAL LOW (ref 78.0–100.0)
Platelets: 245 10*3/uL (ref 150–400)
RBC: 5.45 MIL/uL — AB (ref 3.87–5.11)
RDW: 15.5 % (ref 11.5–15.5)
WBC: 15.2 10*3/uL — AB (ref 4.0–10.5)

## 2017-08-13 LAB — BASIC METABOLIC PANEL
ANION GAP: 12 (ref 5–15)
BUN: 22 mg/dL — ABNORMAL HIGH (ref 6–20)
CHLORIDE: 99 mmol/L (ref 98–111)
CO2: 26 mmol/L (ref 22–32)
Calcium: 9.4 mg/dL (ref 8.9–10.3)
Creatinine, Ser: 1.1 mg/dL — ABNORMAL HIGH (ref 0.44–1.00)
Glucose, Bld: 113 mg/dL — ABNORMAL HIGH (ref 70–99)
POTASSIUM: 3.1 mmol/L — AB (ref 3.5–5.1)
SODIUM: 137 mmol/L (ref 135–145)

## 2017-08-13 LAB — I-STAT TROPONIN, ED
TROPONIN I, POC: 0.02 ng/mL (ref 0.00–0.08)
Troponin i, poc: 0 ng/mL (ref 0.00–0.08)

## 2017-08-13 LAB — I-STAT BETA HCG BLOOD, ED (MC, WL, AP ONLY)

## 2017-08-13 MED ORDER — FAMOTIDINE 20 MG PO TABS
20.0000 mg | ORAL_TABLET | Freq: Once | ORAL | Status: AC
  Administered 2017-08-13: 20 mg via ORAL
  Filled 2017-08-13: qty 1

## 2017-08-13 MED ORDER — PANTOPRAZOLE SODIUM 40 MG PO TBEC: 40.0000 mg | DELAYED_RELEASE_TABLET | Freq: Once | ORAL | Status: DC

## 2017-08-13 MED ORDER — GI COCKTAIL ~~LOC~~
30.0000 mL | Freq: Once | ORAL | Status: AC
  Administered 2017-08-13: 30 mL via ORAL
  Filled 2017-08-13: qty 30

## 2017-08-13 MED ORDER — POTASSIUM CHLORIDE CRYS ER 20 MEQ PO TBCR
40.0000 meq | EXTENDED_RELEASE_TABLET | Freq: Once | ORAL | Status: DC
  Filled 2017-08-13: qty 2

## 2017-08-13 MED ORDER — FAMOTIDINE 20 MG PO TABS: 20.0000 mg | ORAL_TABLET | Freq: Two times a day (BID) | ORAL | 0 refills | Status: AC

## 2017-08-13 NOTE — Discharge Instructions (Addendum)
You were seen in the hospital for upper abdominal and chest pain.  Your test did not show any obvious signs of any heart injury.  This is likely due to reflux and we are prescribing some acid medication.  You should also use Maalox or Tums in between meals and at that time.  Please follow-up with your doctor and return if any worsening symptoms.

## 2017-08-13 NOTE — ED Notes (Signed)
Patient given discharge instructions and verbalized understanding.  Patient stable to discharge at this time.  Patient is alert and oriented to baseline.  No distressed noted at this time.  All belongings taken with the patient at discharge.   

## 2017-08-13 NOTE — ED Triage Notes (Signed)
Pt presents for evaluation of chest pain and shortness of breath x 2-3 days. Pain is generalized to chest.

## 2017-08-13 NOTE — ED Provider Notes (Signed)
[facility] Provider Note   CSN: [id number] Arrival date & time: 08/13/17  [id number]     History   Chief Complaint Chief Complaint  Patient presents with  . Chest Pain    HPI Mary Morse is a 37 y.o. female.  She presents to the emergency department with complaint of 3 days of upper abdominal pain radiating up into her chest.  And moderate to severe.  She calls the pain burning is associated with some sour acid taste in her mouth.  It is intermittently causing her to feel short of breath.  There is been no vomiting no diarrhea no fevers no chills.  She is had a history reflux but she is concerned because she had a strong family history of cardiac disease.  She has no prior history of cardiac disease herself.  The history is provided by the patient.  Chest Pain   This is a new problem. The current episode started more than 2 days ago. The problem occurs constantly. The problem has not changed since onset.The pain is present in the substernal region. The pain is moderate. The quality of the pain is described as burning. The pain radiates to the epigastrium. Associated symptoms include abdominal pain, nausea and shortness of breath. Pertinent negatives include no diaphoresis, no fever, no headaches, no hemoptysis, no sputum production, no syncope and no vomiting. She has tried nothing for the symptoms. The treatment provided no relief.  Her past medical history is significant for anxiety/panic attacks.  Pertinent negatives for past medical history include no hyperlipidemia, no hypertension and no strokes.  Her family medical history is significant for CAD.    Past Medical History:  Diagnosis Date  . Acute pyelonephritis 12/2015; 01/07/2016  . Anxiety   . Borderline diabetes   . Chronic back pain    "sometimes all over" (01/07/2016)  . Complication of anesthesia    felt that the first surgery she had like she had a hard time waking up.  .  Constipation   . GERD (gastroesophageal reflux disease)    "when I was pregnant; now only q once in awhile" (01/07/2016)  . History of kidney stones   . Migraine    "2-3 times/month" (01/07/2016)    Patient Active Problem List   Diagnosis Date Noted  . Hypokalemia 01/07/2016  . Obesity 01/07/2016  . Recurrent pyelonephritis   . History of nephrolithiasis   . Acute pyelonephritis 12/26/2015  . Migraine 12/26/2015  . Chronic back pain   . Gallstones 11/13/2012  . Renal calculus, right 11/11/2012    Past Surgical History:  Procedure Laterality Date  . ABDOMINAL HYSTERECTOMY  05/2011  . CHOLECYSTECTOMY N/A 12/06/2012   Procedure: LAPAROSCOPIC CHOLECYSTECTOMY WITH INTRAOPERATIVE CHOLANGIOGRAM;  Surgeon: Robyne AskewPaul S Toth III, MD;  [facility];  Service: General;  Laterality: N/A;  . LITHOTRIPSY  ~ 2010; 10/2012  . REDUCTION MAMMAPLASTY Bilateral 2013  . TUBAL LIGATION  2007     OB History   None      Home Medications    Prior to Admission medications   Medication Sig Start Date End Date Taking? Authorizing Provider  nitrofurantoin, macrocrystal-monohydrate, (MACROBID) 100 MG capsule Take 1 capsule (100 mg total) by mouth 2 (two) times daily. X 7 days 04/29/17   Dione BoozeGlick, David, MD    Family History Family History  Problem Relation Age of Onset  . Cancer Father        prostate  . Cancer Paternal Grandmother  breast    Social History Social History   Tobacco Use  . Smoking status: Never Smoker  . Smokeless tobacco: Never Used  Substance Use Topics  . Alcohol use: No  . Drug use: No     Allergies   Patient has no known allergies.   Review of Systems Review of Systems  Constitutional: Negative for diaphoresis and fever.  HENT: Negative for sore throat.   Eyes: Negative for visual disturbance.  Respiratory: Positive for shortness of breath. Negative for hemoptysis and sputum production.   Cardiovascular: Positive for chest pain. Negative for syncope.    Gastrointestinal: Positive for abdominal pain and nausea. Negative for vomiting.  Genitourinary: Negative for dysuria.  Musculoskeletal: Negative for neck pain.  Skin: Negative for rash.  Neurological: Negative for headaches.     Physical Exam Updated Vital Signs BP 112/72 (BP Location: Right Arm)   Pulse 94   Temp 98.4 F (36.9 C) (Oral)   Resp 16   SpO2 100%   Physical Exam  Constitutional: She appears well-developed and well-nourished. No distress.  HENT:  Head: Normocephalic and atraumatic.  Eyes: Conjunctivae are normal.  Neck: Neck supple.  Cardiovascular: Normal rate, regular rhythm and normal pulses.  No murmur heard. Pulmonary/Chest: Effort normal and breath sounds normal. No respiratory distress.  Abdominal: Soft. There is no tenderness.  Musculoskeletal: Normal range of motion. She exhibits no edema.       Right lower leg: She exhibits no tenderness and no edema.       Left lower leg: She exhibits no tenderness and no edema.  Neurological: She is alert.  Skin: Skin is warm and dry. Capillary refill takes less than 2 seconds.  Psychiatric: She has a normal mood and affect.  Nursing note and vitals reviewed.    ED Treatments / Results  Labs (all labs ordered are listed, but only abnormal results are displayed) Labs Reviewed  BASIC METABOLIC PANEL - Abnormal; Notable for the following components:      Result Value   Potassium 3.1 (*)    Glucose, Bld 113 (*)    BUN 22 (*)    Creatinine, Ser 1.10 (*)    All other components within normal limits  CBC - Abnormal; Notable for the following components:   WBC 15.2 (*)    RBC 5.45 (*)    MCV 76.3 (*)    MCH 23.7 (*)    All other components within normal limits  I-STAT TROPONIN, ED  I-STAT BETA HCG BLOOD, ED ([facility], [facility], [facility] ONLY)    EKG EKG Interpretation  Date/Time:  Sunday August 13 2017 08:55:49 EDT Ventricular Rate:  90 PR Interval:  128 QRS Duration: 80 QT Interval:  366 QTC Calculation: 447 R  Axis:   104 Text Interpretation:  Normal sinus rhythm with sinus arrhythmia Rightward axis Borderline ECG similar to prior 4/19 Confirmed by Meridee Score [phone]) on 08/13/2017 11:18:41 AM   Radiology Dg Chest 2 View  Result Date: 08/13/2017 CLINICAL DATA:  Chest pain and shortness-of-breath with syncopal episodes. EXAM: CHEST - 2 VIEW COMPARISON:  04/28/2017 FINDINGS: The heart size and mediastinal contours are within normal limits. Both lungs are clear. The visualized skeletal structures are unremarkable. IMPRESSION: No active cardiopulmonary disease. Electronically Signed   By: Elberta Fortis M.D.   On: 08/13/2017 10:15    Procedures Procedures (including critical care time)  Medications Ordered in ED Medications  gi cocktail (Maalox,Lidocaine,Donnatal) (30 mLs Oral Given 08/13/17 1133)  famotidine (PEPCID) tablet 20 mg (20  mg Oral Given 08/13/17 1254)     Initial Impression / Assessment and Plan / ED Course  I have reviewed the triage vital signs and the nursing notes.  Pertinent labs & imaging results that were available during my care of the patient were reviewed by me and considered in my medical decision making (see chart for details).  Clinical Course as of Aug 13 1816  Sun Aug 13, 2017  1234 Patient's had 2 tropes are negative.  Her lab work has slightly elevated white count with no obvious source and no fever.  Her potassium is slightly low at 3.1.  She had some relief with a GI cocktail and has not had any significant recurrence of her symptoms.  I think it is reasonable to get her on a PPI and have her follow-up with her PCP.   [MB]    Clinical Course User Index [MB] Terrilee Files, MD    Final Clinical Impressions(s) / ED Diagnoses   Final diagnoses:  Nonspecific chest pain  Gastroesophageal reflux disease, esophagitis presence not specified    ED Discharge Orders        Ordered    famotidine (PEPCID) 20 MG tablet  2 times daily     08/13/17 1239        Terrilee Files, MD 08/13/17 1819

## 2017-08-19 IMAGING — US US RENAL
1 series · 14 of 25 positions shown · non-contrast
Comparison: CT scan 01/07/2016

CLINICAL DATA: Right flank pain

EXAM:
RENAL / URINARY TRACT ULTRASOUND COMPLETE

[Series 1: us renal · 0.30mm/px · 14 of 35 slices shown]
[im 1/35]
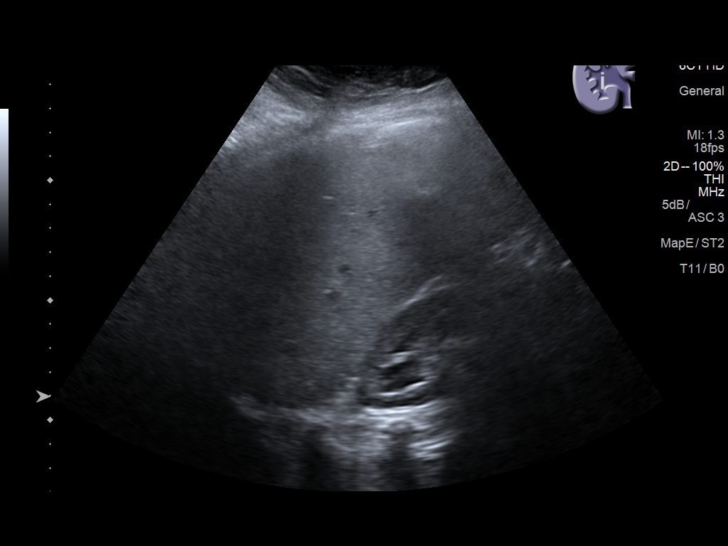
[im 3/35]
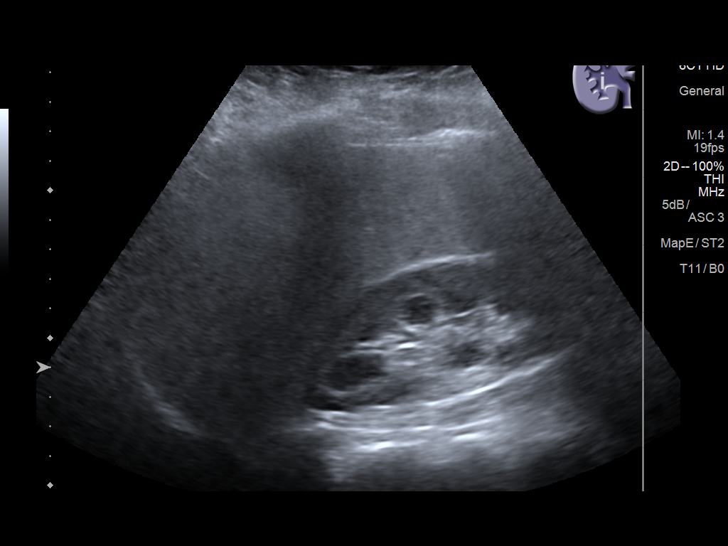
[im 6/35]
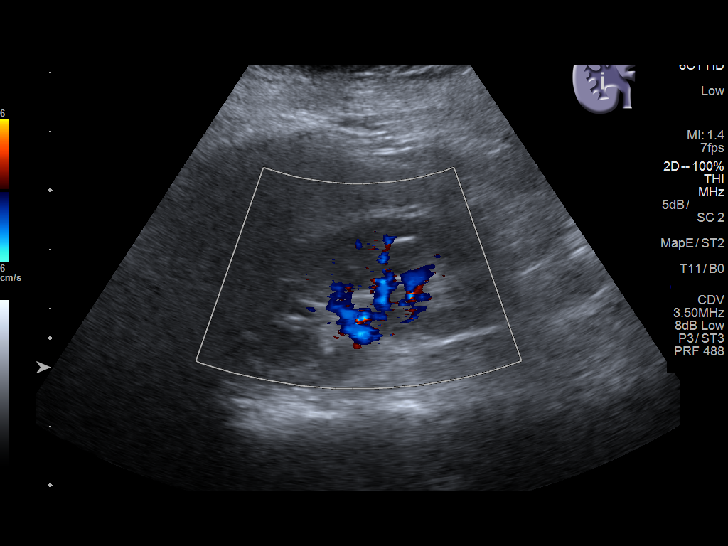
[im 9/35]
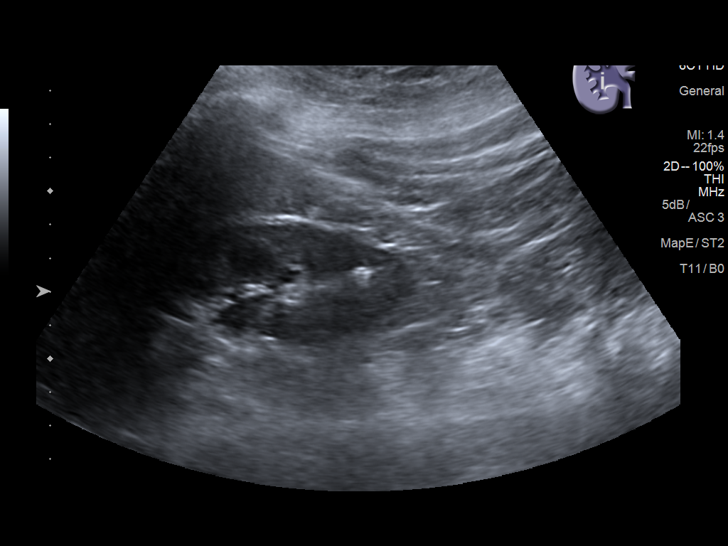
[im 12/35]
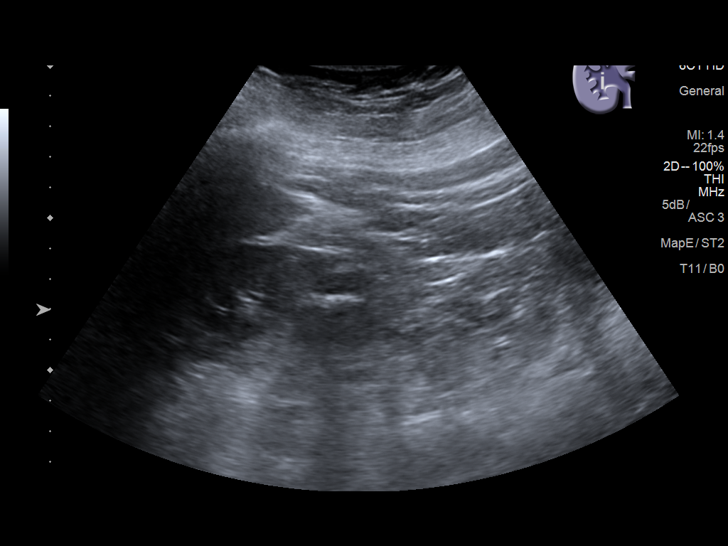
[im 13/35]
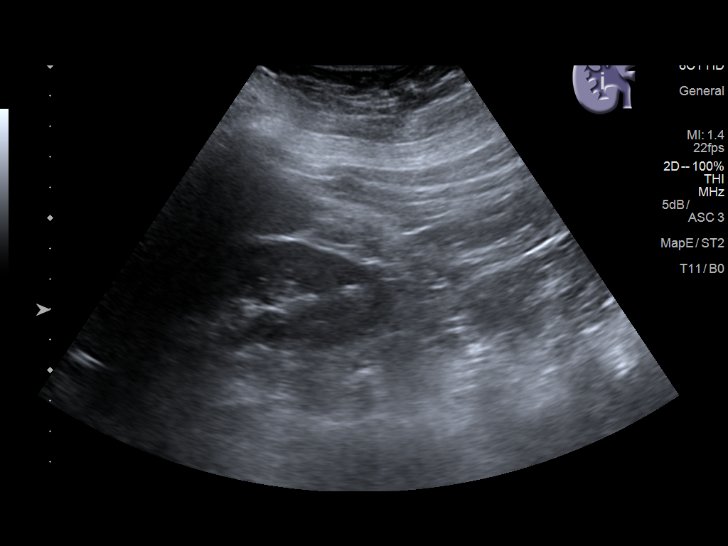
[im 16/35]
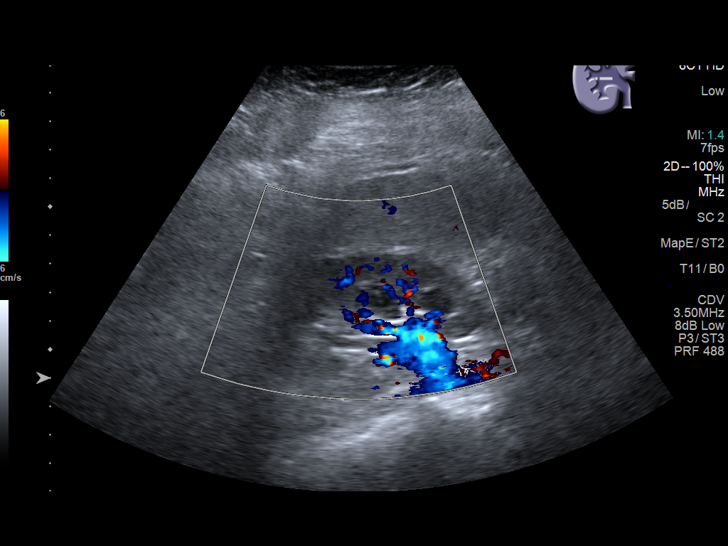
[im 19/35]
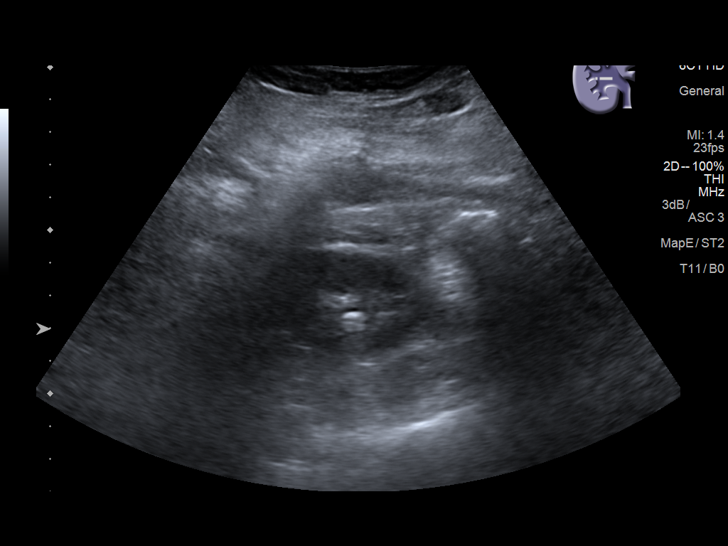
[im 22/35]
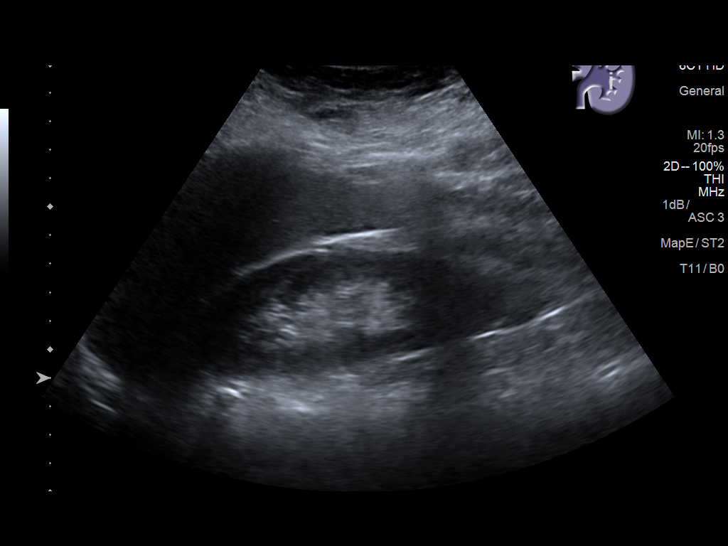
[im 23/35]
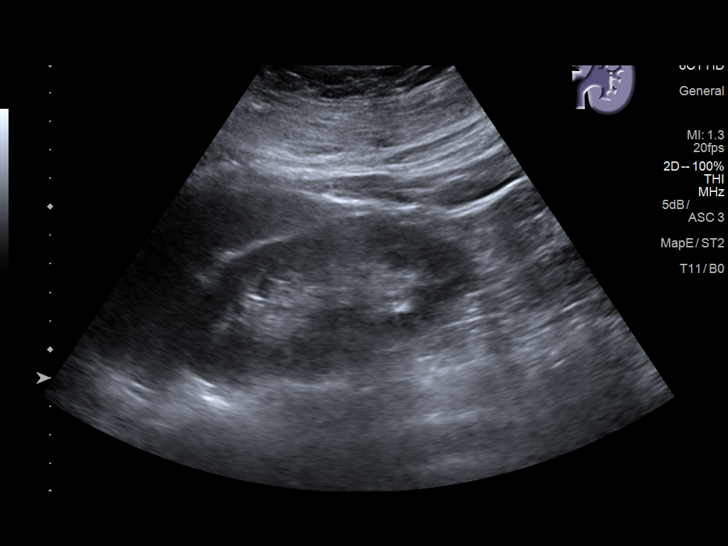
[im 26/35]
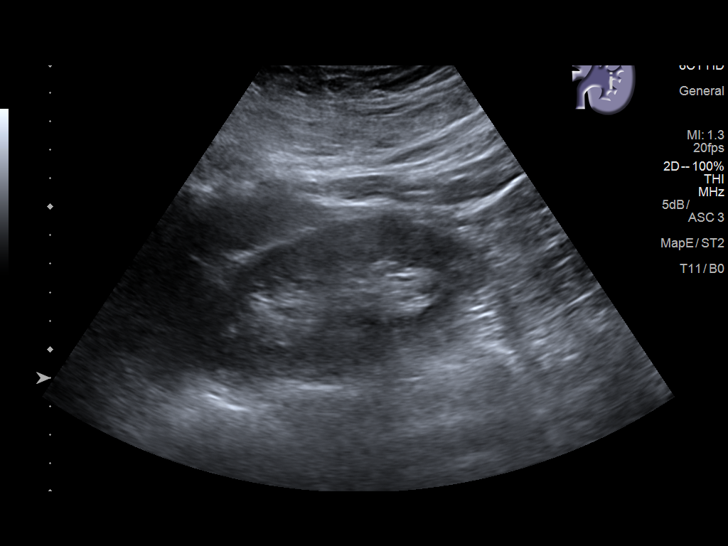
[im 29/35]
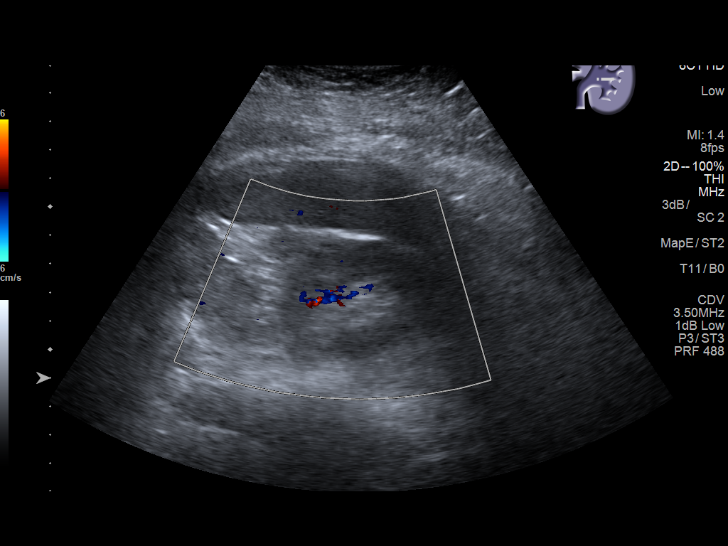
[im 32/35]
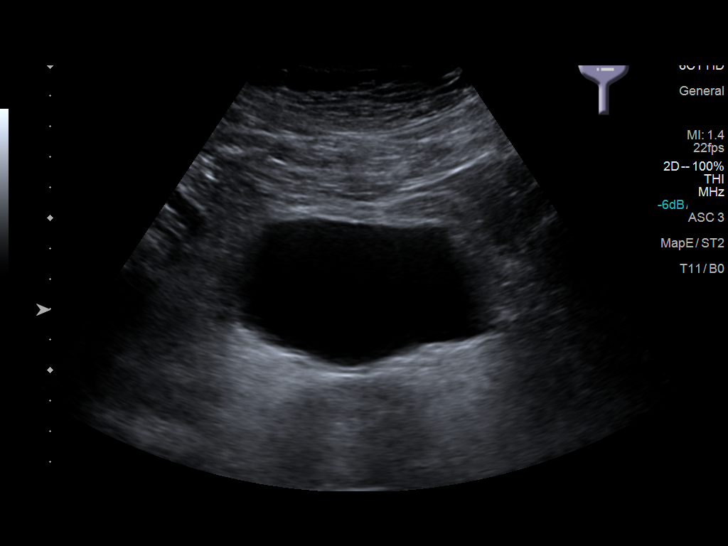
[im 35/35]
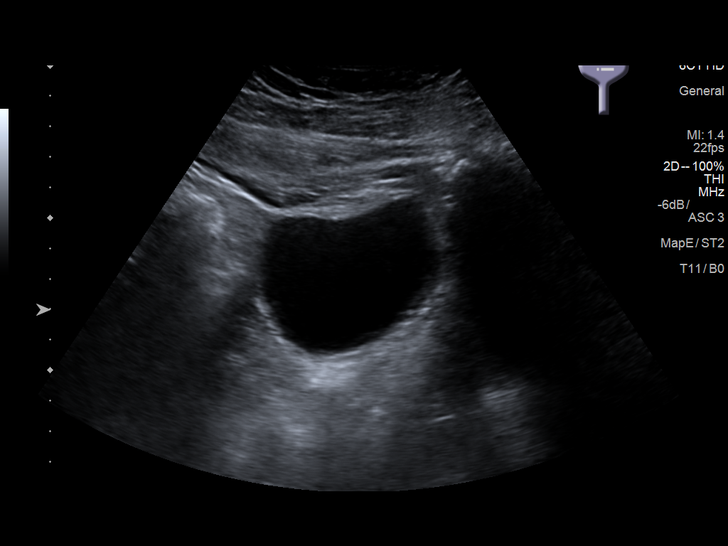

[14 of 25 positions shown; findings below may reference images not displayed]

FINDINGS: Right Kidney:

Length: 11.9 cm. No hydronephrosis. Normal renal echogenicity. There
is 1.2 cm calcified shadowing calculus in lower pole.

Left Kidney:

Length: 11.1 cm. Echogenicity within normal limits. No mass or
hydronephrosis visualized.

Bladder:

Appears normal for degree of bladder distention.
IMPRESSION: There is right nonobstructive nephrolithiasis. Largest calculus in
lower pole measures 1.2 cm. No hydronephrosis.

## 2017-10-16 ENCOUNTER — Other Ambulatory Visit: Payer: Self-pay

## 2017-10-16 ENCOUNTER — Encounter (HOSPITAL_COMMUNITY): Payer: Self-pay | Admitting: *Deleted

## 2017-10-16 ENCOUNTER — Emergency Department (HOSPITAL_COMMUNITY)
Admission: EM | Admit: 2017-10-16 | Discharge: 2017-10-16 | Disposition: A | Discharge status: Home / Self Care | Payer: Self-pay | Attending: Emergency Medicine | Admitting: Emergency Medicine

## 2017-10-16 DIAGNOSIS — Z79899 Other long term (current) drug therapy: Secondary | ICD-10-CM | POA: Insufficient documentation

## 2017-10-16 DIAGNOSIS — M549 Dorsalgia, unspecified: Secondary | ICD-10-CM | POA: Insufficient documentation

## 2017-10-16 MED ORDER — CYCLOBENZAPRINE HCL 10 MG PO TABS: 10.0000 mg | ORAL_TABLET | Freq: Three times a day (TID) | ORAL | 0 refills | Status: DC

## 2017-10-16 MED ORDER — IBUPROFEN 600 MG PO TABS: 600.0000 mg | ORAL_TABLET | Freq: Three times a day (TID) | ORAL | 0 refills | Status: AC
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
## 2017-10-16 NOTE — ED Notes (Signed)
No signature pad availble. Pt ambulatory with no s/sx of distress
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
## 2017-10-16 NOTE — Discharge Instructions (Addendum)
Take medications as prescribed. Follow up with your doctor for recheck if pain continues.

## 2017-10-22 ENCOUNTER — Emergency Department (HOSPITAL_COMMUNITY): Admission: EM | Admit: 2017-10-22 | Discharge: 2017-10-23 | Discharge status: Against Medical Advice | Payer: Self-pay

## 2017-10-22 NOTE — ED Notes (Signed)
Pt states she does not want to wait and is leaving

## 2017-10-23 ENCOUNTER — Emergency Department (HOSPITAL_COMMUNITY)
Admission: EM | Admit: 2017-10-23 | Discharge: 2017-10-23 | Discharge status: Against Medical Advice | Payer: Self-pay | Attending: Emergency Medicine | Admitting: Emergency Medicine

## 2017-10-23 ENCOUNTER — Encounter (HOSPITAL_COMMUNITY): Payer: Self-pay | Admitting: Emergency Medicine

## 2017-10-23 ENCOUNTER — Other Ambulatory Visit: Payer: Self-pay

## 2017-10-23 ENCOUNTER — Emergency Department (HOSPITAL_COMMUNITY)
Admission: EM | Admit: 2017-10-23 | Discharge: 2017-10-24 | Disposition: A | Discharge status: Home / Self Care | Payer: Self-pay | Attending: Emergency Medicine | Admitting: Emergency Medicine

## 2017-10-23 DIAGNOSIS — Y9389 Activity, other specified: Secondary | ICD-10-CM | POA: Insufficient documentation

## 2017-10-23 DIAGNOSIS — M542 Cervicalgia: Secondary | ICD-10-CM | POA: Insufficient documentation

## 2017-10-23 DIAGNOSIS — Y999 Unspecified external cause status: Secondary | ICD-10-CM | POA: Insufficient documentation

## 2017-10-23 DIAGNOSIS — Z5321 Procedure and treatment not carried out due to patient leaving prior to being seen by health care provider: Secondary | ICD-10-CM | POA: Insufficient documentation

## 2017-10-23 DIAGNOSIS — M549 Dorsalgia, unspecified: Secondary | ICD-10-CM | POA: Insufficient documentation

## 2017-10-23 DIAGNOSIS — Y92410 Unspecified street and highway as the place of occurrence of the external cause: Secondary | ICD-10-CM | POA: Insufficient documentation

## 2017-10-23 DIAGNOSIS — S29019A Strain of muscle and tendon of unspecified wall of thorax, initial encounter: Secondary | ICD-10-CM | POA: Insufficient documentation

## 2017-10-23 DIAGNOSIS — S39012A Strain of muscle, fascia and tendon of lower back, initial encounter: Secondary | ICD-10-CM

## 2017-10-23 NOTE — ED Notes (Signed)
No answer in the lobby, women's bathroom, or out in the front when called for a room.
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
## 2017-10-23 NOTE — ED Notes (Signed)
Pt not seen in lobby when called for room

## 2017-10-24 ENCOUNTER — Emergency Department (HOSPITAL_COMMUNITY): Payer: Self-pay

## 2017-10-24 MED ORDER — CYCLOBENZAPRINE HCL 10 MG PO TABS: 10.0000 mg | ORAL_TABLET | Freq: Three times a day (TID) | ORAL | 0 refills | Status: AC | PRN
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
## 2017-10-24 NOTE — ED Notes (Signed)
Patient transported to X-ray 

## 2017-10-24 NOTE — Discharge Instructions (Signed)
We recommend that you continue use of 600 mg ibuprofen every 6 hours.  You have been prescribed Flexeril to take for muscle spasms.  This medication costs $4 at [employer].  Do not drive or drink alcohol after taking Flexeril as it may make you drowsy and impair your judgment.  Alternate ice and heat to areas of injury to limit inflammation and swelling.  You may also try using over-the-counter Salonpas.  Follow-up with your primary care doctor to ensure symptoms resolved.
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
## 2019-02-07 ENCOUNTER — Encounter (HOSPITAL_COMMUNITY): Payer: Self-pay | Admitting: *Deleted

## 2019-02-07 ENCOUNTER — Emergency Department (HOSPITAL_COMMUNITY)
Admission: EM | Admit: 2019-02-07 | Discharge: 2019-02-07 | Disposition: A | Discharge status: Home / Self Care | Payer: Self-pay | Attending: Emergency Medicine | Admitting: Emergency Medicine

## 2019-02-07 ENCOUNTER — Other Ambulatory Visit: Payer: Self-pay

## 2019-02-07 DIAGNOSIS — Z5321 Procedure and treatment not carried out due to patient leaving prior to being seen by health care provider: Secondary | ICD-10-CM | POA: Insufficient documentation

## 2019-02-07 LAB — URINALYSIS, ROUTINE W REFLEX MICROSCOPIC
Bilirubin Urine: NEGATIVE
Glucose, UA: NEGATIVE mg/dL
Hgb urine dipstick: NEGATIVE
Ketones, ur: NEGATIVE mg/dL
Nitrite: POSITIVE — AB
Protein, ur: 100 mg/dL — AB
Specific Gravity, Urine: 1.021 (ref 1.005–1.030)
pH: 6 (ref 5.0–8.0)

## 2019-02-07 NOTE — ED Triage Notes (Signed)
Pt states she went to a hospital near [place] and feels she did not get the care she needed. Rambles from having kidney stones to "having blisters and pain down there" No states her ears and throat hurts. The longer she is in triage the more symptoms

## 2019-02-07 NOTE — ED Notes (Signed)
Pt called from the lobby with no response x2 

## 2019-02-07 NOTE — ED Notes (Signed)
Pt called from the lobby with no response x3 

## 2019-02-07 NOTE — ED Notes (Signed)
Pt called from the lobby with no response x1 

## 2019-10-27 ENCOUNTER — Emergency Department (HOSPITAL_COMMUNITY): Payer: Self-pay

## 2019-10-27 ENCOUNTER — Encounter (HOSPITAL_COMMUNITY): Payer: Self-pay | Admitting: Emergency Medicine

## 2019-10-27 ENCOUNTER — Emergency Department (HOSPITAL_COMMUNITY)
Admission: EM | Admit: 2019-10-27 | Discharge: 2019-10-28 | Disposition: A | Discharge status: Home / Self Care | Payer: Self-pay | Attending: Emergency Medicine | Admitting: Emergency Medicine

## 2019-10-27 ENCOUNTER — Other Ambulatory Visit: Payer: Self-pay

## 2019-10-27 DIAGNOSIS — R519 Headache, unspecified: Secondary | ICD-10-CM | POA: Insufficient documentation

## 2019-10-27 DIAGNOSIS — R0789 Other chest pain: Secondary | ICD-10-CM | POA: Insufficient documentation

## 2019-10-27 DIAGNOSIS — Z5321 Procedure and treatment not carried out due to patient leaving prior to being seen by health care provider: Secondary | ICD-10-CM | POA: Insufficient documentation

## 2019-10-27 DIAGNOSIS — N390 Urinary tract infection, site not specified: Secondary | ICD-10-CM | POA: Insufficient documentation

## 2019-10-27 DIAGNOSIS — R202 Paresthesia of skin: Secondary | ICD-10-CM | POA: Insufficient documentation

## 2019-10-27 LAB — CBC
HCT: 38 % (ref 36.0–46.0)
Hemoglobin: 11.7 g/dL — ABNORMAL LOW (ref 12.0–15.0)
MCH: 23.9 pg — ABNORMAL LOW (ref 26.0–34.0)
MCHC: 30.8 g/dL (ref 30.0–36.0)
MCV: 77.6 fL — ABNORMAL LOW (ref 80.0–100.0)
Platelets: 181 10*3/uL (ref 150–400)
RBC: 4.9 MIL/uL (ref 3.87–5.11)
RDW: 17.4 % — ABNORMAL HIGH (ref 11.5–15.5)
WBC: 9.6 10*3/uL (ref 4.0–10.5)
nRBC: 0 % (ref 0.0–0.2)

## 2019-10-27 LAB — TROPONIN I (HIGH SENSITIVITY): Troponin I (High Sensitivity): <2 ng/L (ref <18)

## 2019-10-27 LAB — BASIC METABOLIC PANEL
Anion gap: 11 (ref 5–15)
BUN: 10 mg/dL (ref 6–20)
CO2: 24 mmol/L (ref 22–32)
Calcium: 9.1 mg/dL (ref 8.9–10.3)
Chloride: 104 mmol/L (ref 98–111)
Creatinine, Ser: 0.92 mg/dL (ref 0.44–1.00)
GFR calc Af Amer: >60 mL/min (ref >60)
GFR calc non Af Amer: >60 mL/min (ref >60)
Glucose, Bld: 94 mg/dL (ref 70–99)
Potassium: 3.7 mmol/L (ref 3.5–5.1)
Sodium: 139 mmol/L (ref 135–145)

## 2019-10-27 NOTE — ED Notes (Signed)
Please have patient call her dad

## 2019-10-27 NOTE — ED Triage Notes (Signed)
Pt reports L sided chest pain x 2 days.  States she was diagnosed with UTI yesterday.  Also reports "L sided brain pain" and tingling in R fingertips for "a few minutes the other day and it went away".  No arm drift.

## 2019-10-27 NOTE — ED Notes (Signed)
Patient states she is leavin

## 2020-02-04 ENCOUNTER — Other Ambulatory Visit: Payer: Self-pay

## 2020-02-04 ENCOUNTER — Encounter (HOSPITAL_COMMUNITY): Payer: Self-pay | Admitting: Emergency Medicine

## 2020-02-04 ENCOUNTER — Emergency Department (HOSPITAL_COMMUNITY)
Admission: EM | Admit: 2020-02-04 | Discharge: 2020-02-05 | Disposition: A | Discharge status: Home / Self Care | Payer: Self-pay | Attending: Emergency Medicine | Admitting: Emergency Medicine

## 2020-02-04 DIAGNOSIS — T401X1A Poisoning by heroin, accidental (unintentional), initial encounter: Secondary | ICD-10-CM | POA: Insufficient documentation

## 2020-02-04 LAB — COMPREHENSIVE METABOLIC PANEL
ALT: 14 U/L (ref 0–44)
AST: 20 U/L (ref 15–41)
Albumin: 3.7 g/dL (ref 3.5–5.0)
Alkaline Phosphatase: 80 U/L (ref 38–126)
Anion gap: 11 (ref 5–15)
BUN: 9 mg/dL (ref 6–20)
CO2: 23 mmol/L (ref 22–32)
Calcium: 9.1 mg/dL (ref 8.9–10.3)
Chloride: 106 mmol/L (ref 98–111)
Creatinine, Ser: 0.97 mg/dL (ref 0.44–1.00)
GFR, Estimated: >60 mL/min (ref >60)
Glucose, Bld: 102 mg/dL — ABNORMAL HIGH (ref 70–99)
Potassium: 3.4 mmol/L — ABNORMAL LOW (ref 3.5–5.1)
Sodium: 140 mmol/L (ref 135–145)
Total Bilirubin: 0.5 mg/dL (ref 0.3–1.2)
Total Protein: 7.3 g/dL (ref 6.5–8.1)

## 2020-02-04 LAB — SALICYLATE LEVEL: Salicylate Lvl: <7 mg/dL — ABNORMAL LOW (ref 7.0–30.0)

## 2020-02-04 LAB — ACETAMINOPHEN LEVEL: Acetaminophen (Tylenol), Serum: <10 ug/mL — ABNORMAL LOW (ref 10–30)

## 2020-02-04 LAB — ETHANOL: Alcohol, Ethyl (B): 95 mg/dL — ABNORMAL HIGH (ref <10)

## 2020-02-04 NOTE — ED Provider Notes (Signed)
[facility] EMERGENCY DEPARTMENT Provider Note   CSN: [id number] Arrival date & time: 02/04/20  2136     History Chief Complaint  Patient presents with   Drug Overdose    Mary Morse is a 40 y.o. female.  The history is provided by the patient and medical records.  Drug Overdose   LEVEL V CAVEAT:  OVERDOSE  40 y.o. F with hx of anxiety, GERD, migraine headaches, presenting to the ED for drug OD.  History obtained from friend who brought her into ED tonight.  Apparently patient with daily heroin abuse, overdosed earlier today.  Narcan given by friend x2.  Patient denies SI/HI.    Here, patient thrashing around in bed.  States she did heroin but denies other co-ingestion.    Past Medical History:  Diagnosis Date   Acute pyelonephritis 12/2015; 01/07/2016   Anxiety    Borderline diabetes    Chronic back pain    "sometimes all over" (01/07/2016)   Complication of anesthesia    felt that the first surgery she had like she had a hard time waking up.   Constipation    GERD (gastroesophageal reflux disease)    "when I was pregnant; now only q once in awhile" (01/07/2016)   History of kidney stones    Migraine    "2-3 times/month" (01/07/2016)    Patient Active Problem List   Diagnosis Date Noted   Hypokalemia 01/07/2016   Obesity 01/07/2016   Recurrent pyelonephritis    History of nephrolithiasis    Acute pyelonephritis 12/26/2015   Migraine 12/26/2015   Chronic back pain    Gallstones 11/13/2012   Renal calculus, right 11/11/2012    Past Surgical History:  Procedure Laterality Date   ABDOMINAL HYSTERECTOMY  05/2011   CHOLECYSTECTOMY N/A 12/06/2012   Procedure: LAPAROSCOPIC CHOLECYSTECTOMY WITH INTRAOPERATIVE CHOLANGIOGRAM;  Surgeon: Robyne Askew, MD;  Location: [facility] OR;  Service: General;  Laterality: N/A;   LITHOTRIPSY  ~ 2010; 10/2012   REDUCTION MAMMAPLASTY Bilateral 2013   TUBAL LIGATION  2007     OB History    No obstetric history on file.     Family History  Problem Relation Age of Onset   Cancer Father        prostate   Cancer Paternal Grandmother        breast    Social History   Tobacco Use   Smoking status: Never Smoker   Smokeless tobacco: Never Used  Vaping Use   Vaping Use: Never used  Substance Use Topics   Alcohol use: No   Drug use: No    Home Medications Prior to Admission medications   Medication Sig Start Date End Date Taking? Authorizing Provider  cyclobenzaprine (FLEXERIL) 10 MG tablet Take 1 tablet (10 mg total) by mouth 3 (three) times daily as needed for muscle spasms. 10/24/17   Antony Madura, PA-C  famotidine (PEPCID) 20 MG tablet Take 1 tablet (20 mg total) by mouth 2 (two) times daily. 08/13/17   Terrilee Files, MD  ibuprofen (ADVIL,MOTRIN) 600 MG tablet Take 1 tablet (600 mg total) by mouth 3 (three) times daily. 10/16/17   Elpidio Anis, PA-C  nitrofurantoin, macrocrystal-monohydrate, (MACROBID) 100 MG capsule Take 1 capsule (100 mg total) by mouth 2 (two) times daily. X 7 days Patient not taking: Reported on 08/13/2017 04/29/17   Dione Booze, MD  oxyCODONE (ROXICODONE) 15 MG immediate release tablet Take 15 mg by mouth every 4 (four) hours as needed for pain.  [provider]    Allergies    Buprenorphine hcl  Review of Systems   Review of Systems  Unable to perform ROS: Other    Physical Exam Updated Vital Signs BP (!) 116/95 (BP Location: Right Arm)    Pulse 87    Resp 15   Physical Exam Vitals and nursing note reviewed.  Constitutional:      Appearance: She is well-developed and well-nourished.  HENT:     Head: Normocephalic and atraumatic.     Mouth/Throat:     Mouth: Oropharynx is clear and moist.  Eyes:     Extraocular Movements: EOM normal.     Conjunctiva/sclera: Conjunctivae normal.     Pupils: Pupils are equal, round, and reactive to light.  Cardiovascular:     Rate and Rhythm: Normal rate and regular rhythm.      Heart sounds: Normal heart sounds.  Pulmonary:     Effort: Pulmonary effort is normal.     Breath sounds: Normal breath sounds.  Abdominal:     General: Bowel sounds are normal.     Palpations: Abdomen is soft.  Musculoskeletal:        General: Normal range of motion.     Cervical back: Normal range of motion.  Skin:    General: Skin is warm and dry.  Neurological:     Mental Status: She is alert.     Comments: Awake, alert, answers limited questions, thrashing around in bed but no seizure activity observed  Psychiatric:        Mood and Affect: Mood and affect normal.     ED Results / Procedures / Treatments   Labs (all labs ordered are listed, but only abnormal results are displayed) Labs Reviewed  CBC WITH DIFFERENTIAL/PLATELET - Abnormal; Notable for the following components:      Result Value   RBC 5.16 (*)    MCV 77.7 (*)    MCH 23.6 (*)    RDW 16.4 (*)    All other components within normal limits  COMPREHENSIVE METABOLIC PANEL - Abnormal; Notable for the following components:   Potassium 3.4 (*)    Glucose, Bld 102 (*)    All other components within normal limits  SALICYLATE LEVEL - Abnormal; Notable for the following components:   Salicylate Lvl <7.0 (*)    All other components within normal limits  ACETAMINOPHEN LEVEL - Abnormal; Notable for the following components:   Acetaminophen (Tylenol), Serum <10 (*)    All other components within normal limits  ETHANOL - Abnormal; Notable for the following components:   Alcohol, Ethyl (B) 95 (*)    All other components within normal limits  RAPID URINE DRUG SCREEN, HOSP PERFORMED    EKG None  Radiology No results found.  Procedures Procedures (including critical care time)  Medications Ordered in ED Medications - No data to display  ED Course  I have reviewed the triage vital signs and the nursing notes.  Pertinent labs & imaging results that were available during my care of the patient were reviewed  by me and considered in my medical decision making (see chart for details).    MDM Rules/Calculators/A&P  40 year old female here after reportedly overdosing on heroin.  Given Narcan x2 by friend who brought her to ED.  Apparently she is a daily user and denies suicidal homicidal ideation.  On arrival to treatment area she is thrashing around in bed, but does answer some questions when asked.  She is not having any  focal seizure activity.  She is refusing to sit still or leave monitoring equipment on.  She required 4 person restraint in order to draw blood but was able to shout out "give me something to calm me down".  Suspect some of her behavior is intentional.  Labs as above-- ethanol 95.  Patient continues thrashing about in bed but starting to settle a little.  Remains without seizure activity.  Will monitor.  3:59 AM Patient has been resting here for the past few hours but now awake, alert, calm, and cooperative.  Has not required further narcan.  She has been able to eat here and tolerate oral fluids without issue.  She is now asking for medications for anxiety but given her OD, I do not think this is appropriate.  Remains without SI/HI. Feel she is stable for discharge home.  She iwll be given OP resources for drug abuse treatment if desired.  She may return here for any new/acute changes.  Final Clinical Impression(s) / ED Diagnoses Final diagnoses:  Accidental overdose of heroin, initial encounter [facility])    Rx / DC Orders ED Discharge Orders    None       Garlon Hatchet, PA-C 02/05/20 0403    Charlynne Pander, MD 02/05/20 [phone]

## 2020-02-04 NOTE — ED Notes (Signed)
Patient brought back to hall bed from triage. Was lifted from wheelchair to stretcher. Patient is not answering questions. Thrashing in bed. Will not follow commands or cooperate with nursing care, screams out at staff. Took 3 staff members to get her attached to monitoring equipment. Patient will not remain still for repeat BP.

## 2020-02-04 NOTE — ED Notes (Signed)
Patient continues to trash around in bed, intermittently answers questions, screams out at staff. Patient required multiple staff members to draw labs. Misty Stanley, [place] aware of same and was at bedside.

## 2020-02-04 NOTE — ED Triage Notes (Signed)
Pt BIB friend, who reports she overdosed on heroin and that he had to narcan her twice. Pt with erratic movements in triage, answering questions, and incontinent urine. Pt states she snorts heroin every day, denies SI/HI.

## 2020-02-05 LAB — CBC WITH DIFFERENTIAL/PLATELET
Abs Immature Granulocytes: 0.02 10*3/uL (ref 0.00–0.07)
Basophils Absolute: 0 10*3/uL (ref 0.0–0.1)
Basophils Relative: 0 %
Eosinophils Absolute: 0 10*3/uL (ref 0.0–0.5)
Eosinophils Relative: 0 %
HCT: 40.1 % (ref 36.0–46.0)
Hemoglobin: 12.2 g/dL (ref 12.0–15.0)
Immature Granulocytes: 0 %
Lymphocytes Relative: 24 %
Lymphs Abs: 2.3 10*3/uL (ref 0.7–4.0)
MCH: 23.6 pg — ABNORMAL LOW (ref 26.0–34.0)
MCHC: 30.4 g/dL (ref 30.0–36.0)
MCV: 77.7 fL — ABNORMAL LOW (ref 80.0–100.0)
Monocytes Absolute: 0.5 10*3/uL (ref 0.1–1.0)
Monocytes Relative: 5 %
Neutro Abs: 6.5 10*3/uL (ref 1.7–7.7)
Neutrophils Relative %: 71 %
Platelets: 166 10*3/uL (ref 150–400)
RBC: 5.16 MIL/uL — ABNORMAL HIGH (ref 3.87–5.11)
RDW: 16.4 % — ABNORMAL HIGH (ref 11.5–15.5)
WBC: 9.4 10*3/uL (ref 4.0–10.5)
nRBC: 0 % (ref 0.0–0.2)

## 2020-02-05 NOTE — ED Notes (Signed)
Patient resting at this time.

## 2020-02-05 NOTE — ED Notes (Signed)
Friend arrived to ED to take pt home. Pt requested and given different size paper scrubs. Pt preparing for discharge, AVS printed and given to pt, discharge education complete.

## 2020-02-05 NOTE — ED Notes (Signed)
Attempted to call patient's ride on both numbers again with no answer.

## 2020-02-05 NOTE — ED Notes (Signed)
Pt continues to make phone calls to find a ride home

## 2020-02-05 NOTE — ED Notes (Signed)
Attempted to call patient's ride again with no answer on both numbers

## 2020-02-05 NOTE — ED Notes (Signed)
Patient cleansed of episode of bowel and urinary incontinence. Blue scrubs pants provided. Blue jeans thrown away per request of patient, pockets checked with nothing present in same.

## 2020-02-05 NOTE — ED Notes (Signed)
Patient provided with PO fluids, OK per [place], [place].

## 2020-02-05 NOTE — ED Notes (Addendum)
Called patient's ride at two numbers provided by patient for her boyfriend/ride home: [phone] and [phone]. No answer on either number. Will try to call again.

## 2020-02-05 NOTE — ED Notes (Signed)
Pt making multiple phone calls to find a ride home. Pt has left messages with several friends and family members

## 2020-02-05 NOTE — Discharge Instructions (Signed)
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

## 2020-02-05 NOTE — ED Notes (Signed)
Female visitor at bedside. Patient calm and cooperative with care at this time.

## 2020-02-05 NOTE — ED Notes (Signed)
Patient ate and drank PO fluids without issues. Remains calm and cooperative at this time.
